# Patient Record
Sex: Male | Born: 1960
Health system: Southern US, Community
[De-identification: ages and names within clinical notes are randomized; demographics above are authoritative.]

## PROBLEM LIST (undated history)

## (undated) DIAGNOSIS — S8410XA Injury of peroneal nerve at lower leg level, unspecified leg, initial encounter: Secondary | ICD-10-CM

## (undated) DIAGNOSIS — I10 Essential (primary) hypertension: Secondary | ICD-10-CM

## (undated) DIAGNOSIS — R002 Palpitations: Secondary | ICD-10-CM

## (undated) DIAGNOSIS — I493 Ventricular premature depolarization: Secondary | ICD-10-CM

## (undated) HISTORY — PX: KNEE ARTHROCENTESIS: SUR44

## (undated) HISTORY — PX: CHOLECYSTECTOMY: SHX55

## (undated) HISTORY — PX: OTHER SURGICAL HISTORY: SHX169

## (undated) HISTORY — PX: BACK SURGERY: SHX140

## (undated) HISTORY — PX: COLONOSCOPY W/ POLYPECTOMY: SHX1380

---

## 2015-11-19 ENCOUNTER — Emergency Department (HOSPITAL_COMMUNITY): Payer: BLUE CROSS/BLUE SHIELD

## 2015-11-19 ENCOUNTER — Emergency Department (HOSPITAL_COMMUNITY)
Admission: EM | Admit: 2015-11-19 | Discharge: 2015-11-19 | Disposition: A | Discharge status: Home / Self Care | Payer: BLUE CROSS/BLUE SHIELD | Attending: Emergency Medicine | Admitting: Emergency Medicine

## 2015-11-19 ENCOUNTER — Encounter (HOSPITAL_COMMUNITY): Payer: Self-pay | Admitting: Emergency Medicine

## 2015-11-19 DIAGNOSIS — F17210 Nicotine dependence, cigarettes, uncomplicated: Secondary | ICD-10-CM | POA: Diagnosis not present

## 2015-11-19 DIAGNOSIS — I493 Ventricular premature depolarization: Secondary | ICD-10-CM | POA: Insufficient documentation

## 2015-11-19 DIAGNOSIS — Z79899 Other long term (current) drug therapy: Secondary | ICD-10-CM | POA: Insufficient documentation

## 2015-11-19 DIAGNOSIS — E876 Hypokalemia: Secondary | ICD-10-CM | POA: Insufficient documentation

## 2015-11-19 DIAGNOSIS — R002 Palpitations: Secondary | ICD-10-CM

## 2015-11-19 DIAGNOSIS — I10 Essential (primary) hypertension: Secondary | ICD-10-CM | POA: Insufficient documentation

## 2015-11-19 DIAGNOSIS — Z7982 Long term (current) use of aspirin: Secondary | ICD-10-CM | POA: Diagnosis not present

## 2015-11-19 HISTORY — DX: Injury of peroneal nerve at lower leg level, unspecified leg, initial encounter: S84.10XA

## 2015-11-19 HISTORY — DX: Essential (primary) hypertension: I10

## 2015-11-19 HISTORY — DX: Palpitations: R00.2

## 2015-11-19 HISTORY — DX: Ventricular premature depolarization: I49.3

## 2015-11-19 LAB — I-STAT TROPONIN, ED: Troponin i, poc: 0 ng/mL (ref 0.00–0.08)

## 2015-11-19 LAB — BASIC METABOLIC PANEL
Anion gap: 9 (ref 5–15)
BUN: 6 mg/dL (ref 6–20)
CHLORIDE: 100 mmol/L — AB (ref 101–111)
CO2: 28 mmol/L (ref 22–32)
CREATININE: 0.83 mg/dL (ref 0.61–1.24)
Calcium: 10.2 mg/dL (ref 8.9–10.3)
GFR calc Af Amer: >60 mL/min (ref >60)
GFR calc non Af Amer: >60 mL/min (ref >60)
Glucose, Bld: 114 mg/dL — ABNORMAL HIGH (ref 65–99)
Potassium: 3.3 mmol/L — ABNORMAL LOW (ref 3.5–5.1)
Sodium: 137 mmol/L (ref 135–145)

## 2015-11-19 LAB — CBC
HEMATOCRIT: 47.8 % (ref 39.0–52.0)
HEMOGLOBIN: 16.3 g/dL (ref 13.0–17.0)
MCH: 30.7 pg (ref 26.0–34.0)
MCHC: 34.1 g/dL (ref 30.0–36.0)
MCV: 90 fL (ref 78.0–100.0)
PLATELETS: 241 10*3/uL (ref 150–400)
RBC: 5.31 MIL/uL (ref 4.22–5.81)
RDW: 12.3 % (ref 11.5–15.5)
WBC: 7.4 10*3/uL (ref 4.0–10.5)

## 2015-11-19 MED ORDER — POTASSIUM CHLORIDE CRYS ER 20 MEQ PO TBCR
40.0000 meq | EXTENDED_RELEASE_TABLET | Freq: Once | ORAL | Status: AC
  Administered 2015-11-19: 40 meq via ORAL
  Filled 2015-11-19: qty 2

## 2015-11-19 MED ORDER — POTASSIUM CHLORIDE CRYS ER 20 MEQ PO TBCR: 20.0000 meq | EXTENDED_RELEASE_TABLET | Freq: Every day | ORAL | 0 refills | Status: DC

## 2015-11-19 NOTE — ED Triage Notes (Addendum)
Pt has hx of palpitations, has been worked up at [facility], but has had more PVC's in the past couple days-- makes pt short of breath.  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  Pt c/o throat tightness with feeling like it is difficult to swallow.  Did drink a mountain dew and 2 cups of coffee this morning.

## 2015-11-19 NOTE — Discharge Instructions (Signed)
Get plenty of rest and drink a lot of fluids.  Try to decrease your tobacco intake.  Try to avoid caffeine.  Try to increase your potassium intake in your diet.

## 2015-11-19 NOTE — ED Provider Notes (Signed)
[facility]-EMERGENCY DEPT Provider Note   CSN: [id number] Arrival date & time: 11/19/15  1352  First Provider Contact:  First MD Initiated Contact with Patient 11/19/15 1648        History   Chief Complaint Chief Complaint  Patient presents with  . Palpitations    HPI Stanley Lee is a 55 y.o. male.  He has noticed palpitations, several each hour for the last couple of days. He has a vague sensation of "fatigue", and a tight feeling in his throat. He denies near-syncope, syncope, chest pain, shortness of breath, cough, fever, chills, nausea, vomiting or change in bowel and urinary habits. History of similar palpitations about 10 years ago when he had a comprehensive evaluation. He is trying to decrease his tobacco intake. He does not drink alcohol or take illegal drugs. He denies stress. He is not currently employed because of chronic back pain and left leg weakness. He does not currently have a cardiologist. There are no other no modifying factors.   HPI  Past Medical History:  Diagnosis Date  . Heart palpitations   . Hypertension   . Peroneal nerve injury   . PVC's (premature ventricular contractions)     There are no active problems to display for this patient.   Past Surgical History:  Procedure Laterality Date  . BACK SURGERY    . CHOLECYSTECTOMY    . KNEE ARTHROCENTESIS    . right shoulder surgery         Home Medications    Prior to Admission medications   Medication Sig Start Date End Date Taking? Authorizing Provider  aspirin (ASPIRIN EC) 81 MG EC tablet Take 81 mg by mouth every morning. Swallow whole.   Yes Historical Provider, MD  CALCIUM-MAGNESIUM-ZINC PO Take 1 tablet by mouth every morning.   Yes Historical Provider, MD  Cholecalciferol (VITAMIN D3 PO) Take 1 tablet by mouth daily.   Yes Historical Provider, MD  Coenzyme Q10 (COQ-10) 200 MG CAPS Take 200 mg by mouth every morning.   Yes Historical Provider, MD  CRANBERRY PO Take 1 tablet by mouth  daily.   Yes Historical Provider, MD  losartan-hydrochlorothiazide (HYZAAR) 100-12.5 MG tablet Take 1 tablet by mouth daily. 10/17/15  Yes Historical Provider, MD  Omega-3 Fatty Acids (FISH OIL) 1000 MG CAPS Take 2,000 mg by mouth every morning.   Yes Historical Provider, MD  SELENIUM PO Take 1 tablet by mouth every morning.   Yes Historical Provider, MD  potassium chloride SA (K-DUR,KLOR-CON) 20 MEQ tablet Take 1 tablet (20 mEq total) by mouth daily. 11/19/15   Mancel Bale, MD    Family History No family history on file.  Social History Social History  Substance Use Topics  . Smoking status: Current Every Day Smoker    Types: Cigarettes  . Smokeless tobacco: Never Used  . Alcohol use No     Allergies   Review of patient's allergies indicates not on file.   Review of Systems Review of Systems  All other systems reviewed and are negative.    Physical Exam Updated Vital Signs BP 115/75   Pulse 68   Temp 98.7 F (37.1 C) (Oral)   Resp 22   Ht  (1.93 m)   Wt 240 lb (108.9 kg)   SpO2 99%   BMI 29.21 kg/m   Physical Exam  Constitutional: He is oriented to person, place, and time. He appears well-developed and well-nourished. No distress.  HENT:  Head: Normocephalic and atraumatic.  Right Ear:  External ear normal.  Left Ear: External ear normal.  Eyes: Conjunctivae and EOM are normal. Pupils are equal, round, and reactive to light.  Neck: Normal range of motion and phonation normal. Neck supple.  Cardiovascular: Normal rate, regular rhythm and normal heart sounds.   Pulmonary/Chest: Effort normal and breath sounds normal. He exhibits no bony tenderness.  Abdominal: Soft. There is no tenderness.  Musculoskeletal: Normal range of motion.  Neurological: He is alert and oriented to person, place, and time. No cranial nerve deficit or sensory deficit. Coordination normal.  Skin: Skin is warm, dry and intact.  Psychiatric: He has a normal mood and affect. His behavior  is normal. Judgment and thought content normal.  Nursing note and vitals reviewed.    ED Treatments / Results  Labs (all labs ordered are listed, but only abnormal results are displayed) Labs Reviewed  BASIC METABOLIC PANEL - Abnormal; Notable for the following:       Result Value   Potassium 3.3 (*)    Chloride 100 (*)    Glucose, Bld 114 (*)    All other components within normal limits  CBC  I-STAT TROPOININ, ED    EKG  EKG Interpretation  Date/Time:  Saturday November 19 2015 13:59:28 EDT Ventricular Rate:  95 PR Interval:  156 QRS Duration: 102 QT Interval:  340 QTC Calculation: 427 R Axis:   55 Text Interpretation:  Sinus rhythm with occasional Premature ventricular complexes Otherwise normal ECG No old tracing to compare Confirmed by [facility]  MD, Brynli Ollis [phone]) on 11/19/2015 4:49:18 PM       Radiology Dg Chest 2 View  Result Date: 11/19/2015 CLINICAL DATA:  Palpitations. EXAM: CHEST  2 VIEW COMPARISON:  None FINDINGS: The heart size and mediastinal contours are within normal limits. Both lungs are clear. Spondylosis noted within the thoracic spine. IMPRESSION: No active cardiopulmonary disease. Electronically Signed   By: Signa Kell M.D.   On: 11/19/2015 15:23    Procedures Procedures (including critical care time)  Medications Ordered in ED Medications  potassium chloride SA (K-DUR,KLOR-CON) CR tablet 40 mEq (not administered)     Initial Impression / Assessment and Plan / ED Course  I have reviewed the triage vital signs and the nursing notes.  Pertinent labs & imaging results that were available during my care of the patient were reviewed by me and considered in my medical decision making (see chart for details).  Clinical Course    Medications  potassium chloride SA (K-DUR,KLOR-CON) CR tablet 40 mEq (not administered)    Patient Vitals for the past 24 hrs:  BP Temp Temp src Pulse Resp SpO2 Height Weight  11/19/15 1745 115/75 - - 68 22 99 % - -    11/19/15 1653 136/85 - - 75 24 99 % - -  11/19/15 1402 137/94 98.7 F (37.1 C) Oral 86 18 100 % 6\' 4"  (1.93 m) 240 lb (108.9 kg)    6:06 PM Reevaluation with update and discussion. After initial assessment and treatment, an updated evaluation reveals Findings discussed. Patient now/strains.Mancel Bale L    Final Clinical Impressions(s) / ED Diagnoses   Final diagnoses:  Palpitations  PVC's (premature ventricular contractions)  Hypokalemia    Nursing Notes Reviewed/ Care Coordinated Applicable Imaging Reviewed Interpretation of Laboratory Data incorporated into ED treatment  The patient appears reasonably screened and/or stabilized for discharge and I doubt any other medical condition or other [facility] requiring further screening, evaluation, or treatment in the ED at this time prior to  discharge.  Plan: Home Medications- Continue; Home Treatments- rest, decrease tobacco and caffeine intake; return here if the recommended treatment, does not improve the symptoms; Recommended follow up- PCP 1 week   New Prescriptions New Prescriptions   POTASSIUM CHLORIDE SA (K-DUR,KLOR-CON) 20 MEQ TABLET    Take 1 tablet (20 mEq total) by mouth daily.     Mancel Bale, MD 11/19/15 [phone]

## 2015-12-15 ENCOUNTER — Ambulatory Visit (INDEPENDENT_AMBULATORY_CARE_PROVIDER_SITE_OTHER): Payer: Medicare Other | Admitting: Otolaryngology

## 2015-12-21 ENCOUNTER — Encounter: Payer: Self-pay | Admitting: Internal Medicine

## 2015-12-21 ENCOUNTER — Ambulatory Visit (INDEPENDENT_AMBULATORY_CARE_PROVIDER_SITE_OTHER): Payer: BLUE CROSS/BLUE SHIELD | Admitting: Internal Medicine

## 2015-12-21 VITALS — BP 116/80 | HR 71 | Ht 76.0 in | Wt 230.0 lb

## 2015-12-21 DIAGNOSIS — Z720 Tobacco use: Secondary | ICD-10-CM

## 2015-12-21 DIAGNOSIS — R042 Hemoptysis: Secondary | ICD-10-CM

## 2015-12-21 DIAGNOSIS — F17210 Nicotine dependence, cigarettes, uncomplicated: Secondary | ICD-10-CM | POA: Insufficient documentation

## 2015-12-21 NOTE — Assessment & Plan Note (Signed)
Spirometry 12/21/2015  wnl on no RX    > 3 min discussion I reviewed the Fletcher curve with the patient that basically indicates  if you quit smoking when your best day FEV1 is still well preserved (as is clearly  the case here)  it is highly unlikely you will progress to severe disease and informed the patient there was  no medication on the market that has proven to alter the curve/ its downward trajectory  or the likelihood of progression of their disease(unlike other chronic medical conditions such as atheroclerosis where we do think we can change the natural hx with risk reducing meds)    Therefore stopping smoking and maintaining abstinence is the most important aspect of care, not choice of inhalers or for that matter, doctors.

## 2015-12-21 NOTE — Assessment & Plan Note (Signed)
In setting of RMSF x 36h/ min volume > declined cxr 12/21/2015   He had a nl cxr 11/19/15 and the amt of blood he describes is not likely to represent anything but mild tracheobronchitis ? Related to smoking vs RMSF but either way resolved on doxy  Discussed approp screening for lung ca - see avs  Total time devoted to counseling  = 35/4627m review case with pt/wife Selena BattenKim also a smoker/ discussion of options/alternatives/ personally creating written instructions  in presence of pt  then going over those specific  Instructions directly with the pt including how to use all of the meds but in particular covering each new medication in detail and the difference between the maintenance/automatic meds and the prns using an action plan format for the latter.

## 2015-12-21 NOTE — Patient Instructions (Addendum)
You do not appear to have any significant copd so unlikely you ever will unless you resume smoking  Low-dose CT lung cancer screening is recommended for patients who are 5855-53 years of age with a 30+ pack-year history of smoking, and who are currently smoking or quit <=15 years ago.   You are not eligible for another 3 years and this test is frequently not reimbursed but it is the best screening tool we have - let me know if you would like to pursue it   Pulmonary follow up is as needed

## 2015-12-21 NOTE — Progress Notes (Signed)
Subjective:     Patient ID: Stanley Lee, male   DOB: 28-Jan-1961,     MRN: [id number]  HPI  [age] yowm active smoker limited by L radiculopathy from back injury 2011 freq tick bites >   With acute onset  fever, HA only >  2 days [facility] [place] [facility] [place] [place] > 10 day course of doxy improved  but referred to pulmonary clinic 12/21/2015 by Dr   Stanley Lee for hemoptysis.    12/21/2015 1st [facility] office visit/ Stanley Lee   Chief Complaint  Patient presents with  . Pulmonary Consult    Referred by Dr. Lura EmVinitkumar Lee. Pt states he was dxed with RMSF on 12/17/15. Before he was dxed he had minimal hemoptysis for 2 days. He denies any respiratory co's today.   new onset hemoptysis on 12/14/15= total of two specks of blood over  36 h and resolved on while on advil and one aspirin > resolved off advil and on baby aspirin never sob/ cp and fever gone, never rash, HA gone now. Plans to see Stanley Lee for "stiff neck" though this has also resolved  No obvious day to day or daytime variability or assoc excess/ purulent sputum or mucus plugs or hemoptysis or cp or chest tightness, subjective wheeze or overt sinus or hb symptoms. No unusual exp hx or h/o childhood pna/ asthma or knowledge of premature birth.  Sleeping ok without nocturnal  or early am exacerbation  of respiratory  c/o's or need for noct saba. Also denies any obvious fluctuation of symptoms with weather or environmental changes or other aggravating or alleviating factors except as outlined above   Current Medications, Allergies, Complete Past Medical History, Past Surgical History, Family History, and Social History were reviewed in [facility] electronic medical record.  ROS  The following are not active complaints unless bolded sore throat, dysphagia, dental problems, itching, sneezing,  nasal congestion or excess/ purulent secretions, ear ache,   fever, chills, sweats, unintended wt loss, classically pleuritic or  exertional cp,  orthopnea pnd or leg swelling, presyncope, palpitations, abdominal pain, anorexia, nausea, vomiting, diarrhea  or change in bowel or bladder habits, change in stools or urine, dysuria,hematuria,  rash, arthralgias, visual complaints, headache, numbness, weakness or ataxia or problems with walking or coordination,  change in mood/affect or memory.           Review of Systems     Objective:   Physical Exam amb wm nad   Wt Readings from Last 3 Encounters:  12/21/15 230 lb (104.3 kg)  11/19/15 240 lb (108.9 kg)    Vital signs reviewed   HEENT: nl dentition, turbinates, and oropharynx. Nl external ear canals without cough reflex   NECK :  without JVD/Nodes/TM/ nl carotid upstrokes bilaterally   LUNGS: no acc muscle use,  Nl contour chest which is clear to A and P bilaterally without cough on insp or exp maneuvers   CV:  RRR  no s3 or murmur or increase in P2, no edema   ABD:  soft and nontender with nl inspiratory excursion in the supine position. No bruits or organomegaly, bowel sounds nl  MS:  Nl gait/ ext warm without deformities, calf tenderness, cyanosis or clubbing No obvious joint restrictions   SKIN: warm and dry without lesions    NEURO:  alert, approp, nl sensorium with  no motor deficits    Pt refused repeat cxr since just had one in 11/19/15 (note this was before the hemoptysis)  I personally reviewed images and agree with radiology impression as follows:  CXR:   11/29/15  No active cardiopulmonary disease.      Assessment:

## 2015-12-29 ENCOUNTER — Ambulatory Visit (INDEPENDENT_AMBULATORY_CARE_PROVIDER_SITE_OTHER): Payer: BLUE CROSS/BLUE SHIELD | Admitting: Otolaryngology

## 2015-12-29 DIAGNOSIS — H9313 Tinnitus, bilateral: Secondary | ICD-10-CM | POA: Diagnosis not present

## 2015-12-29 DIAGNOSIS — R070 Pain in throat: Secondary | ICD-10-CM | POA: Diagnosis not present

## 2016-09-21 DIAGNOSIS — G4733 Obstructive sleep apnea (adult) (pediatric): Secondary | ICD-10-CM | POA: Diagnosis not present

## 2016-09-21 DIAGNOSIS — I10 Essential (primary) hypertension: Secondary | ICD-10-CM | POA: Diagnosis not present

## 2017-01-28 DIAGNOSIS — M545 Low back pain: Secondary | ICD-10-CM | POA: Diagnosis not present

## 2017-01-28 DIAGNOSIS — Z6825 Body mass index (BMI) 25.0-25.9, adult: Secondary | ICD-10-CM | POA: Diagnosis not present

## 2017-01-28 DIAGNOSIS — F17210 Nicotine dependence, cigarettes, uncomplicated: Secondary | ICD-10-CM | POA: Diagnosis not present

## 2017-01-28 DIAGNOSIS — Z23 Encounter for immunization: Secondary | ICD-10-CM | POA: Diagnosis not present

## 2017-01-28 DIAGNOSIS — G4733 Obstructive sleep apnea (adult) (pediatric): Secondary | ICD-10-CM | POA: Diagnosis not present

## 2017-01-28 DIAGNOSIS — I10 Essential (primary) hypertension: Secondary | ICD-10-CM | POA: Diagnosis not present

## 2017-04-02 DIAGNOSIS — Z1212 Encounter for screening for malignant neoplasm of rectum: Secondary | ICD-10-CM | POA: Diagnosis not present

## 2017-04-02 DIAGNOSIS — Z1211 Encounter for screening for malignant neoplasm of colon: Secondary | ICD-10-CM | POA: Diagnosis not present

## 2017-04-23 ENCOUNTER — Emergency Department (HOSPITAL_COMMUNITY)
Admission: EM | Admit: 2017-04-23 | Discharge: 2017-04-23 | Disposition: A | Discharge status: Home / Self Care | Payer: Commercial Managed Care - PPO | Attending: Emergency Medicine | Admitting: Emergency Medicine

## 2017-04-23 ENCOUNTER — Encounter (HOSPITAL_COMMUNITY): Payer: Self-pay | Admitting: Emergency Medicine

## 2017-04-23 DIAGNOSIS — R002 Palpitations: Secondary | ICD-10-CM | POA: Diagnosis not present

## 2017-04-23 DIAGNOSIS — Z7982 Long term (current) use of aspirin: Secondary | ICD-10-CM | POA: Insufficient documentation

## 2017-04-23 DIAGNOSIS — I10 Essential (primary) hypertension: Secondary | ICD-10-CM | POA: Insufficient documentation

## 2017-04-23 DIAGNOSIS — F17210 Nicotine dependence, cigarettes, uncomplicated: Secondary | ICD-10-CM | POA: Diagnosis not present

## 2017-04-23 DIAGNOSIS — Z79899 Other long term (current) drug therapy: Secondary | ICD-10-CM | POA: Diagnosis not present

## 2017-04-23 LAB — BASIC METABOLIC PANEL
Anion gap: 9 (ref 5–15)
BUN: 13 mg/dL (ref 6–20)
CO2: 27 mmol/L (ref 22–32)
Calcium: 9.6 mg/dL (ref 8.9–10.3)
Chloride: 104 mmol/L (ref 101–111)
Creatinine, Ser: 0.8 mg/dL (ref 0.61–1.24)
GFR calc Af Amer: >60 mL/min (ref >60)
GFR calc non Af Amer: >60 mL/min (ref >60)
Glucose, Bld: 118 mg/dL — ABNORMAL HIGH (ref 65–99)
Potassium: 4.2 mmol/L (ref 3.5–5.1)
Sodium: 140 mmol/L (ref 135–145)

## 2017-04-23 LAB — CBC WITH DIFFERENTIAL/PLATELET
Basophils Absolute: 0.1 10*3/uL (ref 0.0–0.1)
Basophils Relative: 1 %
Eosinophils Absolute: 0.3 10*3/uL (ref 0.0–0.7)
Eosinophils Relative: 4 %
HCT: 47.8 % (ref 39.0–52.0)
Hemoglobin: 15.5 g/dL (ref 13.0–17.0)
Lymphocytes Relative: 21 %
Lymphs Abs: 1.4 10*3/uL (ref 0.7–4.0)
MCH: 30.2 pg (ref 26.0–34.0)
MCHC: 32.4 g/dL (ref 30.0–36.0)
MCV: 93.2 fL (ref 78.0–100.0)
Monocytes Absolute: 0.4 10*3/uL (ref 0.1–1.0)
Monocytes Relative: 6 %
Neutro Abs: 4.6 10*3/uL (ref 1.7–7.7)
Neutrophils Relative %: 68 %
Platelets: 203 10*3/uL (ref 150–400)
RBC: 5.13 MIL/uL (ref 4.22–5.81)
RDW: 12.9 % (ref 11.5–15.5)
WBC: 6.8 10*3/uL (ref 4.0–10.5)

## 2017-04-23 MED ORDER — PROPRANOLOL HCL 20 MG PO TABS: 20.0000 mg | ORAL_TABLET | Freq: Three times a day (TID) | ORAL | 0 refills | Status: DC | PRN

## 2017-04-23 NOTE — ED Triage Notes (Signed)
Pt reports he has been having episodes of lightheadedness with bp higher than normal.  Also feeling like heart skipping when trying to sleep.

## 2017-04-23 NOTE — ED Provider Notes (Signed)
[facility] EMERGENCY DEPARTMENT Provider Note   CSN: [id number] Arrival date & time: 04/23/17  5284     History   Chief Complaint Chief Complaint  Patient presents with  . Palpitations    HPI Stanley Lee is a 57 y.o. male.   Palpitations       57 year old male with palpitations.  Patient thinks he may be having PVCs.  He has a history of the same.  For the last couple nights he has noticed intermittent palpitations.  He feels like his heart is skipping a beat.  Seemingly random.  Happens anywhere from a few seconds up to several minutes apart.  He denies any associated chest pain, dyspnea, nausea or diaphoresis.  He has not really noticed them during the day.  He reports that he has had issues with PVCs previously several years ago.  He reports workup including stress testing and echocardiogram without significant abnormalities from his best recollection.    He is also complaining what sounds like vertigo.  This is chronic.  Very positional.  When he extends his head and rotates the right he has a spinning sensation.  Past Medical History:  Diagnosis Date  . Heart palpitations   . Hypertension   . Peroneal nerve injury   . PVC's (premature ventricular contractions)     Patient Active Problem List   Diagnosis Date Noted  . Cigarette smoker 12/21/2015  . Hemoptysis 12/21/2015    Past Surgical History:  Procedure Laterality Date  . BACK SURGERY    . CHOLECYSTECTOMY    . KNEE ARTHROCENTESIS    . right shoulder surgery         Home Medications    Prior to Admission medications   Medication Sig Start Date End Date Taking? Authorizing Provider  aspirin (ASPIRIN EC) 81 MG EC tablet Take 81 mg by mouth every morning. Swallow whole.    [provider]  CALCIUM-MAGNESIUM-ZINC PO Take 1 tablet by mouth every morning.    [provider]  Cholecalciferol (VITAMIN D3 PO) Take 1 tablet by mouth daily.    [provider]  Coenzyme Q10  (COQ-10) 200 MG CAPS Take 200 mg by mouth every morning.    [provider]  CRANBERRY PO Take 1 tablet by mouth daily.    [provider]  doxycycline (VIBRAMYCIN) 100 MG capsule Take 1 capsule by mouth 2 (two) times daily. 12/13/15   [provider]  losartan-hydrochlorothiazide (HYZAAR) 100-12.5 MG tablet Take 1 tablet by mouth daily. 10/17/15   [provider]  Omega-3 Fatty Acids (FISH OIL) 1000 MG CAPS Take 2,000 mg by mouth every morning.    [provider]  potassium chloride SA (K-DUR,KLOR-CON) 20 MEQ tablet Take 1 tablet (20 mEq total) by mouth daily. 11/19/15   Mancel Bale, MD  SELENIUM PO Take 1 tablet by mouth every morning.    [provider]  UNABLE TO FIND Med Name: CPAP    [provider]    Family History History reviewed. No pertinent family history.  Social History Social History   Tobacco Use  . Smoking status: Current Every Day Smoker    Packs/day: 1.00    Years: 24.00    Pack years: 24.00    Types: Cigarettes  . Smokeless tobacco: Never Used  Substance Use Topics  . Alcohol use: No  . Drug use: Not on file     Allergies   Accupril [quinapril hcl]   Review of Systems Review of Systems  Cardiovascular: Positive for palpitations.    All systems reviewed and negative, other than as noted in HPI.   Physical Exam Updated Vital Signs BP 139/88   Pulse 64   Temp 98 F (36.7 C) (Oral)   Resp (!) 25   Ht 6\' 4"  (1.93 m)   Wt 102.1 kg (225 lb)   SpO2 99%   BMI 27.39 kg/m   Physical Exam  Constitutional: He is oriented to person, place, and time. He appears well-developed and well-nourished. No distress.  HENT:  Head: Normocephalic and atraumatic.  Eyes: Conjunctivae are normal. Right eye exhibits no discharge. Left eye exhibits no discharge.  Neck: Neck supple.  Cardiovascular: Normal rate, regular rhythm and normal heart sounds. Exam reveals no gallop and no friction rub.  No murmur  heard. Pulmonary/Chest: Effort normal and breath sounds normal. No respiratory distress.  Abdominal: Soft. He exhibits no distension. There is no tenderness.  Musculoskeletal: He exhibits no edema or tenderness.  Neurological: He is alert and oriented to person, place, and time. No cranial nerve deficit. Coordination normal.  Skin: Skin is warm and dry.  Psychiatric: He has a normal mood and affect. His behavior is normal. Thought content normal.  Nursing note and vitals reviewed.    ED Treatments / Results  Labs (all labs ordered are listed, but only abnormal results are displayed) Labs Reviewed  BASIC METABOLIC PANEL - Abnormal; Notable for the following components:      Result Value   Glucose, Bld 118 (*)    All other components within normal limits  CBC WITH DIFFERENTIAL/PLATELET    EKG  EKG Interpretation  Date/Time:  Tuesday April 23 2017 07:28:34 EST Ventricular Rate:  68 PR Interval:    QRS Duration: 108 QT Interval:  390 QTC Calculation: 415 R Axis:   66 Text Interpretation:  Sinus rhythm RSR' in V1 or V2, right VCD or RVH Confirmed by Raeford RazorKohut, Azuri Bozard [phone]([id number]) on 04/23/2017 8:14:49 AM       Radiology No results found.  Procedures Procedures (including critical care time)  Medications Ordered in ED Medications - No data to display   Initial Impression / Assessment and Plan / ED Course  I have reviewed the triage vital signs and the nursing notes.  Pertinent labs & imaging results that were available during my care of the patient were reviewed by me and considered in my medical decision making (see chart for details).     57 year old male with palpitations.  From his description, it does sound like he is having PVCs.  His EKG today is fairly unremarkable.  I did not notice any on the monitor although he was largely asymptomatic while in the emergency room.  Basic labs unremarkable. I do appreciate a murmur. Discussed trying a low-dose of a beta blocker on an  as-needed basis until you follow-up with his PCP.  He may benefit from a Holter monitor.  I doubt emergent process.  Associated pain.  No exertional symptoms recently.  He reports previous workup including echo and stress testing unremarkable although this was several years ago.  Vertigo is likely a separate process and chronic from his description.  No overt red flags.  Final Clinical Impressions(s) / ED Diagnoses   Final diagnoses:  Palpitations    ED Discharge Orders    None       Raeford RazorKohut, Abdullah Rizzi, MD 04/23/17 [phone]

## 2017-06-04 DIAGNOSIS — I10 Essential (primary) hypertension: Secondary | ICD-10-CM | POA: Diagnosis not present

## 2017-06-04 DIAGNOSIS — R002 Palpitations: Secondary | ICD-10-CM | POA: Diagnosis not present

## 2017-06-04 DIAGNOSIS — Z6827 Body mass index (BMI) 27.0-27.9, adult: Secondary | ICD-10-CM | POA: Diagnosis not present

## 2017-06-04 DIAGNOSIS — F17210 Nicotine dependence, cigarettes, uncomplicated: Secondary | ICD-10-CM | POA: Diagnosis not present

## 2017-06-04 DIAGNOSIS — R51 Headache: Secondary | ICD-10-CM | POA: Diagnosis not present

## 2017-06-07 DIAGNOSIS — R9082 White matter disease, unspecified: Secondary | ICD-10-CM | POA: Diagnosis not present

## 2017-06-07 DIAGNOSIS — R42 Dizziness and giddiness: Secondary | ICD-10-CM | POA: Diagnosis not present

## 2017-06-07 DIAGNOSIS — R51 Headache: Secondary | ICD-10-CM | POA: Diagnosis not present

## 2017-07-10 DIAGNOSIS — M4802 Spinal stenosis, cervical region: Secondary | ICD-10-CM | POA: Diagnosis not present

## 2017-07-10 DIAGNOSIS — M5412 Radiculopathy, cervical region: Secondary | ICD-10-CM | POA: Diagnosis not present

## 2017-07-10 DIAGNOSIS — M50221 Other cervical disc displacement at C4-C5 level: Secondary | ICD-10-CM | POA: Diagnosis not present

## 2017-07-10 DIAGNOSIS — M47812 Spondylosis without myelopathy or radiculopathy, cervical region: Secondary | ICD-10-CM | POA: Diagnosis not present

## 2017-08-28 DIAGNOSIS — M47812 Spondylosis without myelopathy or radiculopathy, cervical region: Secondary | ICD-10-CM | POA: Diagnosis not present

## 2017-09-26 DIAGNOSIS — R51 Headache: Secondary | ICD-10-CM | POA: Diagnosis not present

## 2017-09-26 DIAGNOSIS — Z6827 Body mass index (BMI) 27.0-27.9, adult: Secondary | ICD-10-CM | POA: Diagnosis not present

## 2017-09-26 DIAGNOSIS — F17210 Nicotine dependence, cigarettes, uncomplicated: Secondary | ICD-10-CM | POA: Diagnosis not present

## 2017-09-26 DIAGNOSIS — I10 Essential (primary) hypertension: Secondary | ICD-10-CM | POA: Diagnosis not present

## 2017-09-26 DIAGNOSIS — M19041 Primary osteoarthritis, right hand: Secondary | ICD-10-CM | POA: Diagnosis not present

## 2017-09-26 DIAGNOSIS — R002 Palpitations: Secondary | ICD-10-CM | POA: Diagnosis not present

## 2017-11-01 DIAGNOSIS — I10 Essential (primary) hypertension: Secondary | ICD-10-CM | POA: Diagnosis not present

## 2017-11-01 DIAGNOSIS — G4733 Obstructive sleep apnea (adult) (pediatric): Secondary | ICD-10-CM | POA: Diagnosis not present

## 2018-01-16 DIAGNOSIS — Z23 Encounter for immunization: Secondary | ICD-10-CM | POA: Diagnosis not present

## 2018-01-16 DIAGNOSIS — Z6828 Body mass index (BMI) 28.0-28.9, adult: Secondary | ICD-10-CM | POA: Diagnosis not present

## 2018-01-16 DIAGNOSIS — I10 Essential (primary) hypertension: Secondary | ICD-10-CM | POA: Diagnosis not present

## 2018-01-16 DIAGNOSIS — R5383 Other fatigue: Secondary | ICD-10-CM | POA: Diagnosis not present

## 2018-01-16 DIAGNOSIS — F17210 Nicotine dependence, cigarettes, uncomplicated: Secondary | ICD-10-CM | POA: Diagnosis not present

## 2018-01-29 DIAGNOSIS — H4423 Degenerative myopia, bilateral: Secondary | ICD-10-CM | POA: Diagnosis not present

## 2018-01-29 DIAGNOSIS — H5213 Myopia, bilateral: Secondary | ICD-10-CM | POA: Diagnosis not present

## 2018-01-29 DIAGNOSIS — H52203 Unspecified astigmatism, bilateral: Secondary | ICD-10-CM | POA: Diagnosis not present

## 2018-01-29 DIAGNOSIS — H4389 Other disorders of vitreous body: Secondary | ICD-10-CM | POA: Diagnosis not present

## 2018-03-20 ENCOUNTER — Ambulatory Visit (INDEPENDENT_AMBULATORY_CARE_PROVIDER_SITE_OTHER): Payer: Commercial Managed Care - PPO | Admitting: Cardiology

## 2018-03-20 ENCOUNTER — Encounter

## 2018-03-20 ENCOUNTER — Encounter: Payer: Self-pay | Admitting: Cardiology

## 2018-03-20 VITALS — BP 138/78 | HR 63 | Ht 76.0 in | Wt 242.2 lb

## 2018-03-20 DIAGNOSIS — R002 Palpitations: Secondary | ICD-10-CM

## 2018-03-20 DIAGNOSIS — R5383 Other fatigue: Secondary | ICD-10-CM | POA: Diagnosis not present

## 2018-03-20 NOTE — Progress Notes (Signed)
Clinical Summary [name] is a 57 y.o.male seen as new consult, referred by Dr Stanley Lee for fatigue   1. Fatigue/Palpitations - Long history of palpitations and PVCs.  - previous workup in [place] [facility], about 8 years ago. Had echocardiogram, chemical stress test, heart monitor x 7 days. He reports negative workup other than being told he had some PVCs   - Can have palpitations at times. Variable in duration, no clear pattern. Better with toprol, but did have significant fatigue after first starting but tolerating 12.5mg  bid dosing.  - occasional unexplained fatigue at times. No palpitations with these episodes. Can get diaphoretic, dizzy.  - has weaned caffeine, no EtoH. - can have some vertigo.  - denies any chest pain.    Past Medical History:  Diagnosis Date  . Heart palpitations   . Hypertension   . Peroneal nerve injury   . PVC's (premature ventricular contractions)      Allergies  Allergen Reactions  . Accupril [Quinapril Hcl] Other (See Comments)    syncope     Current Outpatient Medications  Medication Sig Dispense Refill  . aspirin (ASPIRIN EC) 81 MG EC tablet Take 81 mg by mouth every morning. Swallow whole.    Marland Kitchen. CALCIUM-MAGNESIUM-ZINC PO Take 1 tablet by mouth every morning.    . Cholecalciferol (VITAMIN D3 PO) Take 1 tablet by mouth daily.    . Coenzyme Q10 (COQ-10) 200 MG CAPS Take 200 mg by mouth every morning.    Marland Kitchen. CRANBERRY PO Take 1 tablet by mouth daily.    Marland Kitchen. doxycycline (VIBRAMYCIN) 100 MG capsule Take 1 capsule by mouth 2 (two) times daily.    Marland Kitchen. losartan-hydrochlorothiazide (HYZAAR) 100-12.5 MG tablet Take 1 tablet by mouth daily.  3  . Omega-3 Fatty Acids (FISH OIL) 1000 MG CAPS Take 2,000 mg by mouth every morning.    . potassium chloride SA (K-DUR,KLOR-CON) 20 MEQ tablet Take 1 tablet (20 mEq total) by mouth daily. 30 tablet 0  . propranolol (INDERAL) 20 MG tablet Take 1 tablet (20 mg total) by mouth 3 (three) times daily as needed  (palpitations). 30 tablet 0  . SELENIUM PO Take 1 tablet by mouth every morning.    Marland Kitchen. UNABLE TO FIND Med Name: CPAP     No current facility-administered medications for this visit.      Past Surgical History:  Procedure Laterality Date  . BACK SURGERY    . CHOLECYSTECTOMY    . KNEE ARTHROCENTESIS    . right shoulder surgery       Allergies  Allergen Reactions  . Accupril [Quinapril Hcl] Other (See Comments)    syncope      No family history on file.   Social History [name] reports that he has been smoking cigarettes. He has a 24.00 pack-year smoking history. He has never used smokeless tobacco. [name] reports that he does not drink alcohol.   Review of Systems CONSTITUTIONAL: per hpi HEENT: Eyes: No visual loss, blurred vision, double vision or yellow sclerae.No hearing loss, sneezing, congestion, runny nose or sore throat.  SKIN: No rash or itching.  CARDIOVASCULAR: per hpi RESPIRATORY: No shortness of breath, cough or sputum.  GASTROINTESTINAL: No anorexia, nausea, vomiting or diarrhea. No abdominal pain or blood.  GENITOURINARY: No burning on urination, no polyuria NEUROLOGICAL: No headache, dizziness, syncope, paralysis, ataxia, numbness or tingling in the extremities. No change in bowel or bladder control.  MUSCULOSKELETAL: No muscle, back pain, joint pain or stiffness.  LYMPHATICS:  No enlarged nodes. No history of splenectomy.  PSYCHIATRIC: No history of depression or anxiety.  ENDOCRINOLOGIC: No reports of sweating, cold or heat intolerance. No polyuria or polydipsia.  Marland Kitchen   Physical Examination Vitals:   03/20/18 0908  BP: 138/78  Pulse: 63  SpO2: 98%   Vitals:   03/20/18 0908  Weight: 242 lb 3.2 oz (109.9 kg)  Height: 6\' 4"  (1.93 m)    Gen: resting comfortably, no acute distress HEENT: no scleral icterus, pupils equal round and reactive, no palptable cervical adenopathy,  CV: RRR, no mr/g, no jvd Resp: Clear to auscultation  bilaterally GI: abdomen is soft, non-tender, non-distended, normal bowel sounds, no hepatosplenomegaly MSK: extremities are warm, no edema.  Skin: warm, no rash Neuro:  no focal deficits Psych: appropriate affect    Assessment and Plan  1. Palpitations/ Fatigue - unclear if any correlation between his PVCs and nonspecific fatigue episodes.  - we will plan for a 30 day event monitor to further evaluate - no further cardiac testing planned at this time. Symptoms not suggestive of ischemia, not suggestive of CHF or valvular heart disease.  - if not able to tolerate beta blocker in the future could consider dilt for his palpitations.      Antoine Poche, M.D.

## 2018-03-20 NOTE — Patient Instructions (Signed)
Your physician recommends that you schedule a follow-up appointment in: PENDING MONITOR RESULTS   Your physician recommends that you continue on your current medications as directed. Please refer to the Current Medication list given to you today.  Your physician has recommended that you wear an event monitor 30 DAYS  Event monitors are medical devices that record the heart's electrical activity. Doctors most often us these monitors to diagnose arrhythmias. Arrhythmias are problems with the speed or rhythm of the heartbeat. The monitor is a small, portable device. You can wear one while you do your normal daily activities. This is usually used to diagnose what is causing palpitations/syncope (passing out).    Thank you for choosing [facility]!!

## 2018-03-25 DIAGNOSIS — R002 Palpitations: Secondary | ICD-10-CM | POA: Diagnosis not present

## 2018-04-07 ENCOUNTER — Telehealth: Payer: Self-pay | Admitting: Cardiology

## 2018-04-07 NOTE — Telephone Encounter (Signed)
Patient said he wore the monitor for 2 weeks and developed a bad irritation where the monitor sat on his chest. Patient said he would be willing to let the irritation clear up first and then reapply a new patch but at this time his skin was too irritated. Patient advised to return the monitor and we could review the results that were available. Verbalized understanding.

## 2018-04-07 NOTE — Telephone Encounter (Signed)
Patient called stating that he had to stop wearing monitor on Saturday.

## 2018-04-24 ENCOUNTER — Telehealth: Payer: Self-pay | Admitting: Cardiology

## 2018-04-24 NOTE — Telephone Encounter (Signed)
Message left on vm that results are not available yet and once they are received, he would be contacted.

## 2018-04-24 NOTE — Telephone Encounter (Signed)
Would like results from monitor  °

## 2018-05-02 ENCOUNTER — Telehealth: Payer: Self-pay | Admitting: *Deleted

## 2018-05-02 NOTE — Telephone Encounter (Signed)
Pt voiced understanding - 3 month f/u scheduled - routed to pcp

## 2018-05-02 NOTE — Telephone Encounter (Signed)
-----   Message from Antoine Poche, MD sent at 05/02/2018 12:23 PM EST ----- Normal heart monitor, no significant abnormalities to explain his symptoms of fatigue. Can can f/u 3 months   Dominga Ferry MD

## 2018-05-22 ENCOUNTER — Ambulatory Visit (INDEPENDENT_AMBULATORY_CARE_PROVIDER_SITE_OTHER): Payer: Commercial Managed Care - PPO | Admitting: Otolaryngology

## 2018-05-22 DIAGNOSIS — H903 Sensorineural hearing loss, bilateral: Secondary | ICD-10-CM

## 2018-05-22 DIAGNOSIS — R42 Dizziness and giddiness: Secondary | ICD-10-CM | POA: Diagnosis not present

## 2018-05-22 DIAGNOSIS — H9313 Tinnitus, bilateral: Secondary | ICD-10-CM | POA: Diagnosis not present

## 2018-06-04 DIAGNOSIS — R42 Dizziness and giddiness: Secondary | ICD-10-CM | POA: Diagnosis not present

## 2018-06-04 DIAGNOSIS — H903 Sensorineural hearing loss, bilateral: Secondary | ICD-10-CM | POA: Diagnosis not present

## 2018-07-18 ENCOUNTER — Ambulatory Visit: Payer: Medicare Other | Admitting: Cardiology

## 2018-07-18 ENCOUNTER — Telehealth: Payer: Self-pay | Admitting: *Deleted

## 2018-07-18 NOTE — Telephone Encounter (Signed)
attempted to reach pt multiple times regarding 07/18/18 appt with Dr Wyline Mood with no return phone call. With current pandemic pt appt canceled and will mail letter to contact office for appt - also placed recall for June

## 2020-02-11 DIAGNOSIS — Z0001 Encounter for general adult medical examination with abnormal findings: Secondary | ICD-10-CM | POA: Diagnosis not present

## 2020-02-11 DIAGNOSIS — Z6827 Body mass index (BMI) 27.0-27.9, adult: Secondary | ICD-10-CM | POA: Diagnosis not present

## 2020-02-11 DIAGNOSIS — R5383 Other fatigue: Secondary | ICD-10-CM | POA: Diagnosis not present

## 2020-02-11 DIAGNOSIS — I10 Essential (primary) hypertension: Secondary | ICD-10-CM | POA: Diagnosis not present

## 2020-02-11 DIAGNOSIS — Z1212 Encounter for screening for malignant neoplasm of rectum: Secondary | ICD-10-CM | POA: Diagnosis not present

## 2020-02-11 DIAGNOSIS — F17210 Nicotine dependence, cigarettes, uncomplicated: Secondary | ICD-10-CM | POA: Diagnosis not present

## 2020-04-20 DIAGNOSIS — G5603 Carpal tunnel syndrome, bilateral upper limbs: Secondary | ICD-10-CM | POA: Diagnosis not present

## 2020-04-20 DIAGNOSIS — M1812 Unilateral primary osteoarthritis of first carpometacarpal joint, left hand: Secondary | ICD-10-CM | POA: Diagnosis not present

## 2020-05-23 DIAGNOSIS — G5602 Carpal tunnel syndrome, left upper limb: Secondary | ICD-10-CM | POA: Diagnosis not present

## 2020-05-23 DIAGNOSIS — Z4789 Encounter for other orthopedic aftercare: Secondary | ICD-10-CM | POA: Diagnosis not present

## 2020-06-06 DIAGNOSIS — M25632 Stiffness of left wrist, not elsewhere classified: Secondary | ICD-10-CM | POA: Diagnosis not present

## 2020-08-30 DIAGNOSIS — R109 Unspecified abdominal pain: Secondary | ICD-10-CM | POA: Diagnosis not present

## 2020-08-30 DIAGNOSIS — Z6826 Body mass index (BMI) 26.0-26.9, adult: Secondary | ICD-10-CM | POA: Diagnosis not present

## 2020-08-30 DIAGNOSIS — R059 Cough, unspecified: Secondary | ICD-10-CM | POA: Diagnosis not present

## 2020-09-06 DIAGNOSIS — R1013 Epigastric pain: Secondary | ICD-10-CM | POA: Diagnosis not present

## 2020-09-06 DIAGNOSIS — R109 Unspecified abdominal pain: Secondary | ICD-10-CM | POA: Diagnosis not present

## 2021-02-21 DIAGNOSIS — Z1211 Encounter for screening for malignant neoplasm of colon: Secondary | ICD-10-CM | POA: Diagnosis not present

## 2021-02-21 DIAGNOSIS — Z1212 Encounter for screening for malignant neoplasm of rectum: Secondary | ICD-10-CM | POA: Diagnosis not present

## 2021-03-24 DIAGNOSIS — K635 Polyp of colon: Secondary | ICD-10-CM | POA: Diagnosis not present

## 2021-03-24 DIAGNOSIS — Z1211 Encounter for screening for malignant neoplasm of colon: Secondary | ICD-10-CM | POA: Diagnosis not present

## 2021-03-28 DIAGNOSIS — Z1211 Encounter for screening for malignant neoplasm of colon: Secondary | ICD-10-CM | POA: Diagnosis not present

## 2021-03-28 DIAGNOSIS — K635 Polyp of colon: Secondary | ICD-10-CM | POA: Diagnosis not present

## 2021-04-26 DIAGNOSIS — D125 Benign neoplasm of sigmoid colon: Secondary | ICD-10-CM | POA: Diagnosis not present

## 2021-05-09 DIAGNOSIS — I10 Essential (primary) hypertension: Secondary | ICD-10-CM | POA: Diagnosis not present

## 2021-05-09 DIAGNOSIS — R519 Headache, unspecified: Secondary | ICD-10-CM | POA: Diagnosis not present

## 2021-05-09 DIAGNOSIS — Z6828 Body mass index (BMI) 28.0-28.9, adult: Secondary | ICD-10-CM | POA: Diagnosis not present

## 2021-05-09 DIAGNOSIS — M542 Cervicalgia: Secondary | ICD-10-CM | POA: Diagnosis not present

## 2021-05-09 DIAGNOSIS — G8929 Other chronic pain: Secondary | ICD-10-CM | POA: Diagnosis not present

## 2021-06-09 DIAGNOSIS — R519 Headache, unspecified: Secondary | ICD-10-CM | POA: Diagnosis not present

## 2021-07-04 ENCOUNTER — Encounter: Payer: Self-pay | Admitting: Neurology

## 2021-09-12 ENCOUNTER — Other Ambulatory Visit (HOSPITAL_COMMUNITY): Payer: Self-pay

## 2021-09-12 MED ORDER — METOPROLOL SUCCINATE ER 25 MG PO TB24
25.0000 mg | ORAL_TABLET | Freq: Every day | ORAL | 6 refills | Status: DC
  Filled 2021-09-12: qty 30, 30d supply, fill #0
  Filled 2021-10-05: qty 30, 30d supply, fill #1

## 2021-09-12 MED ORDER — LOSARTAN POTASSIUM 100 MG PO TABS
ORAL_TABLET | ORAL | 6 refills | Status: DC
  Filled 2021-09-12: qty 30, 30d supply, fill #0
  Filled 2021-10-05: qty 30, 30d supply, fill #1

## 2021-09-14 ENCOUNTER — Other Ambulatory Visit (HOSPITAL_COMMUNITY): Payer: Self-pay

## 2021-09-14 MED ORDER — METOPROLOL SUCCINATE ER 25 MG PO TB24
ORAL_TABLET | ORAL | 3 refills | Status: DC
  Filled 2021-11-03: qty 90, 90d supply, fill #0

## 2021-09-14 MED ORDER — LOSARTAN POTASSIUM 100 MG PO TABS
ORAL_TABLET | ORAL | 3 refills | Status: DC
  Filled 2021-09-14 – 2021-11-03 (×2): qty 90, 90d supply, fill #0

## 2021-09-20 ENCOUNTER — Other Ambulatory Visit: Payer: Self-pay

## 2021-09-20 ENCOUNTER — Encounter (HOSPITAL_COMMUNITY): Payer: Self-pay | Admitting: Emergency Medicine

## 2021-09-20 ENCOUNTER — Emergency Department (HOSPITAL_COMMUNITY)
Admission: EM | Admit: 2021-09-20 | Discharge: 2021-09-20 | Disposition: A | Discharge status: Home / Self Care | Payer: 59 | Attending: Emergency Medicine | Admitting: Emergency Medicine

## 2021-09-20 ENCOUNTER — Emergency Department (HOSPITAL_COMMUNITY): Payer: 59

## 2021-09-20 DIAGNOSIS — I10 Essential (primary) hypertension: Secondary | ICD-10-CM | POA: Insufficient documentation

## 2021-09-20 DIAGNOSIS — R0602 Shortness of breath: Secondary | ICD-10-CM | POA: Diagnosis not present

## 2021-09-20 DIAGNOSIS — R42 Dizziness and giddiness: Secondary | ICD-10-CM | POA: Diagnosis not present

## 2021-09-20 DIAGNOSIS — Z79899 Other long term (current) drug therapy: Secondary | ICD-10-CM | POA: Diagnosis not present

## 2021-09-20 DIAGNOSIS — R531 Weakness: Secondary | ICD-10-CM | POA: Insufficient documentation

## 2021-09-20 DIAGNOSIS — R001 Bradycardia, unspecified: Secondary | ICD-10-CM | POA: Diagnosis not present

## 2021-09-20 DIAGNOSIS — R002 Palpitations: Secondary | ICD-10-CM | POA: Diagnosis not present

## 2021-09-20 DIAGNOSIS — R5383 Other fatigue: Secondary | ICD-10-CM | POA: Insufficient documentation

## 2021-09-20 LAB — CBC
HCT: 47.1 % (ref 39.0–52.0)
Hemoglobin: 15.4 g/dL (ref 13.0–17.0)
MCH: 30.3 pg (ref 26.0–34.0)
MCHC: 32.7 g/dL (ref 30.0–36.0)
MCV: 92.7 fL (ref 80.0–100.0)
Platelets: 213 10*3/uL (ref 150–400)
RBC: 5.08 MIL/uL (ref 4.22–5.81)
RDW: 12.6 % (ref 11.5–15.5)
WBC: 6.6 10*3/uL (ref 4.0–10.5)
nRBC: 0 % (ref 0.0–0.2)

## 2021-09-20 LAB — COMPREHENSIVE METABOLIC PANEL
ALT: 19 U/L (ref 0–44)
AST: 24 U/L (ref 15–41)
Albumin: 4.3 g/dL (ref 3.5–5.0)
Alkaline Phosphatase: 68 U/L (ref 38–126)
Anion gap: 2 — ABNORMAL LOW (ref 5–15)
BUN: 10 mg/dL (ref 6–20)
CO2: 31 mmol/L (ref 22–32)
Calcium: 9.5 mg/dL (ref 8.9–10.3)
Chloride: 108 mmol/L (ref 98–111)
Creatinine, Ser: 0.82 mg/dL (ref 0.61–1.24)
GFR, Estimated: >60 mL/min (ref >60)
Glucose, Bld: 106 mg/dL — ABNORMAL HIGH (ref 70–99)
Potassium: 4.4 mmol/L (ref 3.5–5.1)
Sodium: 141 mmol/L (ref 135–145)
Total Bilirubin: 1.1 mg/dL (ref 0.3–1.2)
Total Protein: 7.8 g/dL (ref 6.5–8.1)

## 2021-09-20 LAB — TROPONIN I (HIGH SENSITIVITY): Troponin I (High Sensitivity): 3 ng/L (ref <18)

## 2021-09-20 LAB — MAGNESIUM: Magnesium: 2.3 mg/dL (ref 1.7–2.4)

## 2021-09-20 NOTE — ED Notes (Signed)
From triage to exam room in w/c. Endorses intermittent palpitations and dizziness. Pt alert, NAD, calm, interactive, resps e/u, speaking in clear complete sentences. Steady gait from w/c to room 9 b/r. Wife at [facility].

## 2021-09-20 NOTE — ED Notes (Signed)
Pt ambulated around the ER, Pts O2 was 99-100% Pts pulse was 73

## 2021-09-20 NOTE — ED Triage Notes (Signed)
Pt here for evaluation of bradycardia that started around 7 am , lowest at home was HR-43, currently feels weak and lightheaded.

## 2021-09-20 NOTE — Discharge Instructions (Signed)
Your lab tests and exam are reassuring today.  You will need follow-up with Dr. Wyline Mood as discussed.  I will let you decide whether you want to continue taking your as needed metoprolol, although this is a very small dose it can certainly be affecting your heart rate.

## 2021-09-20 NOTE — ED Provider Notes (Signed)
[facility] EMERGENCY DEPARTMENT Provider Note   CSN: [id number] Arrival date & time: 09/20/21  1214     History  Chief Complaint  Patient presents with   Bradycardia    Stanley Lee is a 61 y.o. male with a history including hypertension, infrequent PVCs for which she has seen Dr. Harl Bowie in the past presenting for evaluation of symptomatic bradycardia.  He describes having this problem for at least the last several years, but states and tell today his symptoms which include generalized weakness and fatigue would be transient, perhaps lasting for an hour or less, presenting today because he has felt poorly since yesterday and started monitoring his pulses on his home machine and getting numbers ranging from 43 to the low 50s.  He denies chest pain, shortness of breath, palpitations, diaphoresis.  He denies peripheral edema, cough orthopnea.  He has found no alleviators for symptoms.  He is on losartan, also takes metoprolol 12.5 mg prn for episodic pvc's. He states he takes this medicine about 3 times per week on average.    Additionally he reports being evaluated for the episodic dizziness by neurology, reporting having had tilt table testing and MRI within the past 12 months and negative.  He has also had thyroid function tests by his pcp, negative.    The history is provided by the patient.      Home Medications Prior to Admission medications   Medication Sig Start Date End Date Taking? Authorizing Provider  CALCIUM-MAGNESIUM-ZINC PO Take 1 tablet by mouth. Occasionally    [provider]  Coenzyme Q10 (COQ-10) 200 MG CAPS Take 200 mg by mouth. Takes 2-3 x per week    [provider]  losartan (COZAAR) 100 MG tablet Take 1 tablet by mouth daily. 03/15/18   [provider]  losartan (COZAAR) 100 MG tablet Take 1 tablet by mouth once daily 09/12/21     losartan (COZAAR) 100 MG tablet TAKE 1 TABLET BY MOUTH EVERY DAY FOR HIGH BLOOD PRESSURE 09/14/21      metoprolol succinate (TOPROL-XL) 25 MG 24 hr tablet Take 0.5 tablets by mouth daily. Does take 0.5 twice a day occasionally. 03/15/18   [provider]  metoprolol succinate (TOPROL-XL) 25 MG 24 hr tablet TAKE ONE TABLET BY MOUTH EVERY DAY 09/12/21     metoprolol succinate (TOPROL-XL) 25 MG 24 hr tablet TAKE ONE TABLET BY MOUTH EVERY DAY 09/14/21     Omega-3 Fatty Acids (FISH OIL) 1000 MG CAPS Take 2,000 mg by mouth. Takes 2-3 x per week    [provider]  SELENIUM PO Take 1 tablet by mouth. Occasionally    [provider]  UNABLE TO FIND Med Name: CPAP    [provider]      Allergies    Accupril [quinapril hcl]    Review of Systems   Review of Systems  Constitutional:  Positive for fatigue. Negative for fever.  HENT:  Negative for congestion and sore throat.   Eyes: Negative.   Respiratory:  Negative for chest tightness and shortness of breath.   Cardiovascular:  Positive for palpitations. Negative for chest pain.  Gastrointestinal:  Negative for abdominal pain and nausea.  Genitourinary: Negative.   Musculoskeletal:  Negative for arthralgias, joint swelling and neck pain.  Skin: Negative.  Negative for rash and wound.  Neurological:  Negative for dizziness, weakness, light-headedness, numbness and headaches.  Psychiatric/Behavioral: Negative.     Physical Exam Updated Vital Signs BP (!) 151/84   Pulse  83   Temp 97.8 F (36.6 C) (Oral)   Resp 18   Ht 6\' 4"  (1.93 m)   Wt 100.7 kg   SpO2 90%   BMI 27.02 kg/m  Physical Exam Vitals and nursing note reviewed.  Constitutional:      Appearance: He is well-developed.  HENT:     Head: Normocephalic and atraumatic.  Eyes:     Conjunctiva/sclera: Conjunctivae normal.  Cardiovascular:     Rate and Rhythm: Regular rhythm. Bradycardia present.     Heart sounds: Normal heart sounds. No murmur heard. Pulmonary:     Effort: Pulmonary effort is normal.     Breath sounds: Normal breath sounds. No  wheezing.  Abdominal:     General: Bowel sounds are normal.     Palpations: Abdomen is soft.     Tenderness: There is no abdominal tenderness.  Musculoskeletal:        General: Normal range of motion.     Cervical back: Normal range of motion.  Skin:    General: Skin is warm and dry.  Neurological:     Mental Status: He is alert.    ED Results / Procedures / Treatments   Labs (all labs ordered are listed, but only abnormal results are displayed) Labs Reviewed  COMPREHENSIVE METABOLIC PANEL - Abnormal; Notable for the following components:      Result Value   Glucose, Bld 106 (*)    Anion gap 2 (*)    All other components within normal limits  CBC  MAGNESIUM  TROPONIN I (HIGH SENSITIVITY)    EKG EKG Interpretation  Date/Time:  Wednesday September 20 2021 12:49:19 EDT Ventricular Rate:  55 PR Interval:  148 QRS Duration: 100 QT Interval:  420 QTC Calculation: 401 R Axis:   66 Text Interpretation: Sinus bradycardia Incomplete right bundle branch block Borderline ECG Confirmed by Sherwood Gambler [phone]) on 09/20/2021 3:00:34 PM  Radiology DG Chest Portable 1 View  Result Date: 09/20/2021 CLINICAL DATA:  Palpitations.  Shortness of breath.  Bradycardia. EXAM: PORTABLE CHEST 1 VIEW COMPARISON:  11/19/2015 FINDINGS: The heart size and mediastinal contours are within normal limits. Both lungs are clear. The visualized skeletal structures are unremarkable. IMPRESSION: No active disease. Electronically Signed   By: Nelson Chimes M.D.   On: 09/20/2021 13:40    Procedures Procedures    Medications Ordered in ED Medications - No data to display  ED Course/ Medical Decision Making/ A&P                           Medical Decision Making Patient with generalized fatigue and weakness with recognized bradycardia.  He has had the symptoms intermittently for greater than 1 year but has been more transient than currently, the symptom starting yesterday evening and persisted today.  He denies  chest pain, denies peripheral edema, he does have history of PVCs for which he uses as needed metoprolol, but states he only takes 12.5 mg tablet on average 3 times per week, his last dose was taken yesterday evening.  His work-up today is reassuring, he has no electrolyte abnormalities, his EKG is stable, bradycardia at 55.  He remained on the monitor during his ED stay and is pulse rates ranged from 45-55 on average.  He was ambulated in the department and his pulse rate improved to 83.  Amount and/or Complexity of Data Reviewed Labs: ordered.    Details: Reviewed, no electrolyte light abnormalities, normal troponin. Discussion of  management or test interpretation with external provider(s): Discussed patient with Dr. Johnsie Cancel of cardiology.  No further work-up indicated today, plus or minus metoprolol depending on how bothersome the PVCs are.  He does recommend close follow-up with Dr. Harl Bowie stating he would benefit from a cardiac stress test to ensure that his heart rate appropriately response to exercise.  Discussed this plan with patient and wife at bedside and they are agreeable to this plan.  Risk Prescription drug management.           Final Clinical Impression(s) / ED Diagnoses Final diagnoses:  Bradycardia  Other fatigue    Rx / DC Orders ED Discharge Orders     None         Landis Martins 09/20/21 1553    Sherwood Gambler, MD 09/21/21 1538

## 2021-10-05 ENCOUNTER — Other Ambulatory Visit (HOSPITAL_COMMUNITY): Payer: Self-pay

## 2021-10-06 ENCOUNTER — Other Ambulatory Visit (HOSPITAL_COMMUNITY): Payer: Self-pay

## 2021-10-09 ENCOUNTER — Other Ambulatory Visit (HOSPITAL_COMMUNITY): Payer: Self-pay

## 2021-10-27 ENCOUNTER — Ambulatory Visit: Payer: Medicare Other | Admitting: Neurology

## 2021-11-03 ENCOUNTER — Other Ambulatory Visit (HOSPITAL_COMMUNITY): Payer: Self-pay

## 2021-11-07 ENCOUNTER — Encounter: Payer: Self-pay | Admitting: Cardiology

## 2021-11-07 ENCOUNTER — Ambulatory Visit (INDEPENDENT_AMBULATORY_CARE_PROVIDER_SITE_OTHER): Payer: 59 | Admitting: Cardiology

## 2021-11-07 ENCOUNTER — Other Ambulatory Visit (HOSPITAL_COMMUNITY): Payer: Self-pay

## 2021-11-07 VITALS — BP 140/88 | HR 70 | Ht 76.0 in | Wt 220.4 lb

## 2021-11-07 DIAGNOSIS — I10 Essential (primary) hypertension: Secondary | ICD-10-CM

## 2021-11-07 DIAGNOSIS — R002 Palpitations: Secondary | ICD-10-CM | POA: Diagnosis not present

## 2021-11-07 MED ORDER — AMLODIPINE BESYLATE 10 MG PO TABS
10.0000 mg | ORAL_TABLET | Freq: Every day | ORAL | 6 refills | Status: DC
  Filled 2021-11-07: qty 30, 30d supply, fill #0
  Filled 2021-12-06: qty 30, 30d supply, fill #1

## 2021-11-07 NOTE — Progress Notes (Signed)
Clinical Summary Stanley Lee is a 61 y.o.male seen today for follow up of the following medical problems.   1. Fatigue/Palpitations - Long history of palpitations and PVCs.  - previous workup in [place], about 8 years ago. Had echocardiogram, chemical stress test, heart monitor x 7 days. He reports negative workup other than being told he had some PVCs     - Can have palpitations at times. Variable in duration, no clear pattern. Better with toprol, but did have significant fatigue after first starting but tolerating 12.'5mg'$  bid dosing.  - occasional unexplained fatigue at times. No palpitations with these episodes. Can get diaphoretic, dizzy.  - has weaned caffeine, no EtoH. - can have some vertigo.  - denies any chest pain.    Prior monitor - significant skin irritation from monitor      - 09/2021 ER visit with bradycardia, fatigue - EKG sinus brady 55. From ER notes with ambulation HR up to 73 - he is on toprol  - home EKG monitor low HRs, mid 40s.  - some lightheadedness at times, no dizziness. When he has symptoms checks vitals and reports they are normal, HRs in 70s.  - some fatigue. Episodes of legs feeling rubbery, weak/fatigued. VItals are fine during that time. Episodes up to 5 minutes, longest 3-4 days. Light pressure in head.  - rare palpitations, takes toprol prn. Had taken toprol.      03/2018 30 day monitor 30 day event monitor. Data is available from only 56% of planned monitored time Min HR 44, Max HR 115, Avg HR 67. Min HR in early AM hours presumably while sleeping No symptoms reported Available tracings show sinus rhythm. No significant arrhythmias  Past Medical History:  Diagnosis Date   Heart palpitations    Hypertension    Peroneal nerve injury    PVC's (premature ventricular contractions)      Allergies  Allergen Reactions   Accupril [Quinapril Hcl] Other (See Comments)    syncope     Current Outpatient Medications  Medication  Sig Dispense Refill   CALCIUM-MAGNESIUM-ZINC PO Take 1 tablet by mouth. Occasionally     Coenzyme Q10 (COQ-10) 200 MG CAPS Take 200 mg by mouth. Takes 2-3 x per week     losartan (COZAAR) 100 MG tablet Take 1 tablet by mouth daily.  1   losartan (COZAAR) 100 MG tablet TAKE 1 TABLET BY MOUTH EVERY DAY FOR HIGH BLOOD PRESSURE 90 tablet 3   metoprolol succinate (TOPROL-XL) 25 MG 24 hr tablet Take 0.5 tablets by mouth daily. Does take 0.5 twice a day occasionally.  0   metoprolol succinate (TOPROL-XL) 25 MG 24 hr tablet TAKE 1 TABLET BY MOUTH EVERY DAY 90 tablet 3   Omega-3 Fatty Acids (FISH OIL) 1000 MG CAPS Take 2,000 mg by mouth. Takes 2-3 x per week     SELENIUM PO Take 1 tablet by mouth. Occasionally     UNABLE TO FIND Med Name: CPAP     No current facility-administered medications for this visit.     Past Surgical History:  Procedure Laterality Date   BACK SURGERY     CHOLECYSTECTOMY     COLONOSCOPY W/ POLYPECTOMY     KNEE ARTHROCENTESIS     right shoulder surgery       Allergies  Allergen Reactions   Accupril [Quinapril Hcl] Other (See Comments)    syncope      No family history on file.   Social History Stanley Lee  reports that he has been smoking cigarettes. He has a 24.00 pack-year smoking history. He has never used smokeless tobacco. Stanley Lee reports no history of alcohol use.   Review of Systems CONSTITUTIONAL: No weight loss, fever, chills, weakness or fatigue.  HEENT: Eyes: No visual loss, blurred vision, double vision or yellow sclerae.No hearing loss, sneezing, congestion, runny nose or sore throat.  SKIN: No rash or itching.  CARDIOVASCULAR: per hpi RESPIRATORY: No shortness of breath, cough or sputum.  GASTROINTESTINAL: No anorexia, nausea, vomiting or diarrhea. No abdominal pain or blood.  GENITOURINARY: No burning on urination, no polyuria NEUROLOGICAL: No headache, dizziness, syncope, paralysis, ataxia, numbness or tingling in the extremities. No  change in bowel or bladder control.  MUSCULOSKELETAL: No muscle, back pain, joint pain or stiffness.  LYMPHATICS: No enlarged nodes. No history of splenectomy.  PSYCHIATRIC: No history of depression or anxiety.  ENDOCRINOLOGIC: No reports of sweating, cold or heat intolerance. No polyuria or polydipsia.  Marland Kitchen   Physical Examination Today's Vitals   11/07/21 1307  BP: 140/88  Pulse: 70  SpO2: 98%  Weight: 220 lb 6.4 oz (100 kg)  Height: '6\' 4"'$  (1.93 m)   Body mass index is 26.83 kg/m.  Gen: resting comfortably, no acute distress HEENT: no scleral icterus, pupils equal round and reactive, no palptable cervical adenopathy,  CV: RRR, no m/rg, no jvd Resp: Clear to auscultation bilaterally GI: abdomen is soft, non-tender, non-distended, normal bowel sounds, no hepatosplenomegaly MSK: extremities are warm, no edema.  Skin: warm, no rash Neuro:  no focal deficits Psych: appropriate affect   Diagnostic Studies 03/2018 30 day monitor 30 day event monitor. Data is available from only 56% of planned monitored time Min HR 44, Max HR 115, Avg HR 67. Min HR in early AM hours presumably while sleeping No symptoms reported Available tracings show sinus rhythm. No significant arrhythmias    Assessment and Plan  1. Palpitations/ Fatigue - continue current meds  2. HTN - d/c losartan, start norvasc '10mg'$  daily.      Arnoldo Lenis, M.D.,

## 2021-11-07 NOTE — Patient Instructions (Addendum)
Medication Instructions:  Stop Losartan (Cozaar) Begin Norvasc 10mg  daily  Continue all other medications.     Labwork: none  Testing/Procedures: none  Follow-Up: 6 months   Any Other Special Instructions Will Be Listed Below (If Applicable).   If you need a refill on your cardiac medications before your next appointment, please call your pharmacy.

## 2021-12-06 ENCOUNTER — Other Ambulatory Visit (HOSPITAL_COMMUNITY): Payer: Self-pay

## 2021-12-07 ENCOUNTER — Other Ambulatory Visit (HOSPITAL_COMMUNITY): Payer: Self-pay

## 2022-01-19 ENCOUNTER — Telehealth: Payer: Self-pay | Admitting: Cardiology

## 2022-01-19 ENCOUNTER — Other Ambulatory Visit (HOSPITAL_COMMUNITY): Payer: Self-pay

## 2022-01-19 MED ORDER — LOSARTAN POTASSIUM 100 MG PO TABS
100.0000 mg | ORAL_TABLET | Freq: Every day | ORAL | 3 refills | Status: DC
Start: 2022-01-19 — End: 2023-01-28
  Filled 2022-01-19 – 2022-02-05 (×2): qty 90, 90d supply, fill #0
  Filled 2022-05-02 – 2022-05-07 (×2): qty 90, 90d supply, fill #1
  Filled 2022-08-08: qty 90, 90d supply, fill #2
  Filled 2022-11-06: qty 90, 90d supply, fill #3

## 2022-01-19 NOTE — Telephone Encounter (Signed)
Pt c/o medication issue:  1. Name of Medication: Amlodipine  2. How are you currently taking this medication (dosage and times per day)?   3. Are you having a reaction (difficulty breathing--STAT)? He said the only issue with the Amlodipine was the swelling in his leg  4. What is your medication issue? Patient says he is going back to his :Losartan 100 mg- patient says he will need a new prescription called in for this please.    *STAT* If patient is at the pharmacy, call can be transferred to refill team.   1. Which medications need to be refilled? (please list name of each medication and dose if known)  a new prescription for Losartan   2. Which pharmacy/location (including street and city if local pharmacy) is medication to be sent to? [place], [place]  3. Do they need a 30 day or 90 day supply? 90 days and refills

## 2022-01-19 NOTE — Telephone Encounter (Signed)
Will forward to provider for approval as Losartan was d/c'd at last OV with Dr. Harl Bowie.

## 2022-01-19 NOTE — Telephone Encounter (Signed)
Left message to return call 

## 2022-01-19 NOTE — Telephone Encounter (Signed)
Ok to stop norvac and go back to losartan 100mg  daily  Zandra Abts MD

## 2022-01-19 NOTE — Telephone Encounter (Signed)
Patient notified and verbalized understanding.  New prescription sent to [facility] now.

## 2022-01-29 ENCOUNTER — Other Ambulatory Visit (HOSPITAL_COMMUNITY): Payer: Self-pay

## 2022-01-29 NOTE — Telephone Encounter (Signed)
*  STAT* If patient is at the pharmacy, call can be transferred to refill team.   1. Which medications need to be refilled? (please list name of each medication and dose if known)   losartan (COZAAR) 100 MG tablet  2. Which pharmacy/location (including street and city if local pharmacy) is medication to be sent to?  [place]  3. Do they need a 30 day or 90 day supply? 90 day   Patient stated he still has not received this medication and would like the prescription re-sent to the [facility].

## 2022-01-29 NOTE — Telephone Encounter (Signed)
01/19/2022 faxed to [facility]

## 2022-02-05 ENCOUNTER — Other Ambulatory Visit (HOSPITAL_COMMUNITY): Payer: Self-pay

## 2022-02-27 ENCOUNTER — Other Ambulatory Visit (HOSPITAL_COMMUNITY): Payer: Self-pay | Admitting: Family Medicine

## 2022-02-27 DIAGNOSIS — F17210 Nicotine dependence, cigarettes, uncomplicated: Secondary | ICD-10-CM

## 2022-02-27 DIAGNOSIS — F17200 Nicotine dependence, unspecified, uncomplicated: Secondary | ICD-10-CM

## 2022-03-27 ENCOUNTER — Ambulatory Visit (HOSPITAL_COMMUNITY)
Admission: RE | Admit: 2022-03-27 | Discharge: 2022-03-27 | Disposition: A | Discharge status: Home / Self Care | Payer: 59 | Source: Ambulatory Visit | Attending: Family Medicine | Admitting: Family Medicine

## 2022-03-27 DIAGNOSIS — F17210 Nicotine dependence, cigarettes, uncomplicated: Secondary | ICD-10-CM | POA: Diagnosis not present

## 2022-03-27 DIAGNOSIS — F17200 Nicotine dependence, unspecified, uncomplicated: Secondary | ICD-10-CM | POA: Insufficient documentation

## 2022-04-24 ENCOUNTER — Encounter: Payer: Self-pay | Admitting: Cardiology

## 2022-04-24 ENCOUNTER — Encounter: Payer: Self-pay | Admitting: *Deleted

## 2022-04-24 ENCOUNTER — Ambulatory Visit: Payer: 59 | Attending: Cardiology | Admitting: Cardiology

## 2022-04-24 VITALS — BP 140/78 | HR 68 | Ht 76.0 in | Wt 230.6 lb

## 2022-04-24 DIAGNOSIS — R42 Dizziness and giddiness: Secondary | ICD-10-CM

## 2022-04-24 DIAGNOSIS — I493 Ventricular premature depolarization: Secondary | ICD-10-CM

## 2022-04-24 MED ORDER — ASPIRIN 81 MG PO TBEC: 81.0000 mg | DELAYED_RELEASE_TABLET | Freq: Every day | ORAL | Status: AC

## 2022-04-24 NOTE — Progress Notes (Signed)
Clinical Summary Stanley Lee is a 62 y.o.male seen today for follow up of the following medical problems.   1. Fatigue/Palpitations - Long history of palpitations and PVCs.  - previous workup in [place], about 8 years ago. Had echocardiogram, chemical stress test, heart monitor x 7 days. He reports negative workup other than being told he had some PVCs    Prior monitor - significant skin irritation from monitor    - 09/2021 ER visit with bradycardia, fatigue - EKG sinus brady 55. From ER notes with ambulation HR up to 73 - he is on toprol      03/2018 30 day monitor 30 day event monitor. Data is available from only 56% of planned monitored time Min HR 44, Max HR 115, Avg HR 67. Min HR in early AM hours presumably while sleeping No symptoms reported Available tracings show sinus rhythm. No significant arrhythmias   -lightheadeness, constant 24 hours throughout the day. Not positional. Checks vitals and typically normal. . Has had prior vertigo. Some times associated with eye movement.        2. HTN - last visit stopped losartan, started norvasc  3. Atherosclerosis - aortic atherosclerosis, 3 vessel CAD.  - no chest pains, no SOB/DOE - hauls very heavy wagon during yard work, tolerates well - labs followed by pcp - ongoing smoker     Past Medical History:  Diagnosis Date   Heart palpitations    Hypertension    Peroneal nerve injury    PVC's (premature ventricular contractions)      Allergies  Allergen Reactions   Accupril [Quinapril Hcl] Other (See Comments)    syncope     Current Outpatient Medications  Medication Sig Dispense Refill   Coenzyme Q10 (COQ-10) 200 MG CAPS Take 200 mg by mouth. Takes 2-3 x per week     losartan (COZAAR) 100 MG tablet Take 1 tablet (100 mg total) by mouth daily. 90 tablet 3   metoprolol succinate (TOPROL-XL) 25 MG 24 hr tablet Take 0.5 tablets by mouth daily. Does take 0.5 twice a day occasionally.  0   Omega-3  Fatty Acids (FISH OIL) 1000 MG CAPS Take 2,000 mg by mouth. Takes 2-3 x per week     UNABLE TO FIND Med Name: CPAP     No current facility-administered medications for this visit.     Past Surgical History:  Procedure Laterality Date   BACK SURGERY     CHOLECYSTECTOMY     COLONOSCOPY W/ POLYPECTOMY     KNEE ARTHROCENTESIS     right shoulder surgery       Allergies  Allergen Reactions   Accupril [Quinapril Hcl] Other (See Comments)    syncope      Family History  Problem Relation Age of Onset   AAA (abdominal aortic aneurysm) Mother    Emphysema Father    Hypertension Sister    Heart disease Sister        pacemaker   Stroke Sister    Suicidality Sister    Alcohol abuse Sister    Valvular heart disease Maternal Grandfather      Social History Stanley Lee reports that he has been smoking cigarettes. He has a 24.00 pack-year smoking history. He has never used smokeless tobacco. Stanley Lee reports no history of alcohol use.   Review of Systems CONSTITUTIONAL: No weight loss, fever, chills, weakness or fatigue.  HEENT: Eyes: No visual loss, blurred vision, double vision or yellow sclerae.No hearing loss,  sneezing, congestion, runny nose or sore throat.  SKIN: No rash or itching.  CARDIOVASCULAR: per hpi RESPIRATORY: No shortness of breath, cough or sputum.  GASTROINTESTINAL: No anorexia, nausea, vomiting or diarrhea. No abdominal pain or blood.  GENITOURINARY: No burning on urination, no polyuria NEUROLOGICAL: No headache, dizziness, syncope, paralysis, ataxia, numbness or tingling in the extremities. No change in bowel or bladder control.  MUSCULOSKELETAL: No muscle, back pain, joint pain or stiffness.  LYMPHATICS: No enlarged nodes. No history of splenectomy.  PSYCHIATRIC: No history of depression or anxiety.  ENDOCRINOLOGIC: No reports of sweating, cold or heat intolerance. No polyuria or polydipsia.  Marland Kitchen   Physical Examination Today's Vitals   04/24/22 0957   BP: (!) 140/78  Pulse: 68  SpO2: 97%  Weight: 230 lb 9.6 oz (104.6 kg)  Height: 6\' 4"  (1.93 m)   Body mass index is 28.07 kg/m.  Gen: resting comfortably, no acute distress HEENT: no scleral icterus, pupils equal round and reactive, no palptable cervical adenopathy,  CV: RRR, no m/r/g, no jvd Resp: Clear to auscultation bilaterally GI: abdomen is soft, non-tender, non-distended, normal bowel sounds, no hepatosplenomegaly MSK: extremities are warm, no edema.  Skin: warm, no rash Neuro:  no focal deficits Psych: appropriate affect   Diagnostic Studies 03/2018 30 day monitor 30 day event monitor. Data is available from only 56% of planned monitored time Min HR 44, Max HR 115, Avg HR 67. Min HR in early AM hours presumably while sleeping No symptoms reported Available tracings show sinus rhythm. No significant arrhythmias      Assessment and Plan  1. Dizziness.  - symptoms not consistent with cardiac etiology, prior cardiac testing has not shown any evidence of specific cause - will refer to neuro for evaluation.    2.PVCs - mild symptoms, continue prn toprol. Daily led to low HRs that have resolved with just prn use.    Stanley Lee, M.D.

## 2022-04-24 NOTE — Patient Instructions (Signed)
Medication Instructions:  Begin Aspirin 81mg  daily  Continue all other medications.     Labwork: none  Testing/Procedures: none  Follow-Up: 6 months   Any Other Special Instructions Will Be Listed Below (If Applicable). You have been referred to NEUROLOGY    If you need a refill on your cardiac medications before your next appointment, please call your pharmacy.

## 2022-04-27 ENCOUNTER — Encounter: Payer: Self-pay | Admitting: Neurology

## 2022-05-02 ENCOUNTER — Other Ambulatory Visit (HOSPITAL_COMMUNITY): Payer: Self-pay

## 2022-05-07 ENCOUNTER — Other Ambulatory Visit (HOSPITAL_COMMUNITY): Payer: Self-pay

## 2022-05-09 DIAGNOSIS — M180 Bilateral primary osteoarthritis of first carpometacarpal joints: Secondary | ICD-10-CM | POA: Diagnosis not present

## 2022-05-09 DIAGNOSIS — F17200 Nicotine dependence, unspecified, uncomplicated: Secondary | ICD-10-CM | POA: Diagnosis not present

## 2022-05-09 DIAGNOSIS — G5603 Carpal tunnel syndrome, bilateral upper limbs: Secondary | ICD-10-CM | POA: Diagnosis not present

## 2022-05-09 DIAGNOSIS — G5601 Carpal tunnel syndrome, right upper limb: Secondary | ICD-10-CM | POA: Diagnosis not present

## 2022-05-15 ENCOUNTER — Ambulatory Visit: Payer: 59 | Admitting: Neurology

## 2022-05-23 ENCOUNTER — Ambulatory Visit: Payer: 59 | Admitting: Neurology

## 2022-06-01 NOTE — Progress Notes (Signed)
[facility] Clinic Note - Initial Visit   Date: 06/04/2022   Stanley Lee MRN: [id number] DOB: 28-Oct-1960   Dear Dr. Quillian Quince:  Thank you for your kind referral of Stanley Lee for consultation of dizziness. Although his history is well known to you, please allow [place] to reiterate it for the purpose of our medical record. The patient was accompanied to the clinic by wife who also provides collateral information.     Stanley Lee is a 62 y.o. right-handed male with hypertension, PVCs, s/p lumbar surgery with residual left foot drop presenting for evaluation of dizziness.   IMPRESSION/PLAN: Possible cervicogenic dizziness - Recommended neck PT, however patient would like to proceed with imaging of the cervical spine first.  Patient informed that imaging may be declined by insurance without trying conservative therapy  - He has prior MRIs done at PCP and Dr. Adline Mango office.  We will request these records.  History of lumbar surgery x 2 with residual left foot drop and paresthesias  Further recommendations pending results.   ------------------------------------------------------------- History of present illness: For the past 6 years, he has spells of lightheadedness which occurs 2-3 times per month and lasts a few minutes to a few weeks.  Symptoms are triggered by neck stiffness.  He had not had imaging of the cervical spine. When severe, he has associated numbness in the arms and weakness in the legs.  He checks his HR and BP which is normal during these spells.  He saw cardiology who did not find primary cardiac etiology for symptoms and referred here.  He has seen Dr. Arnoldo Morale at [facility] in the past.  He had MRI performed, but does not recall what part of the neuroaxis.  He also had MRI ordered by PCP, possibly brain.   He has residual left leg foot drop from lumbar surgery in 2011.  He wears a AFO.  No falls.    Past Medical  History:  Diagnosis Date   Heart palpitations    Hypertension    Peroneal nerve injury    PVC's (premature ventricular contractions)     Past Surgical History:  Procedure Laterality Date   BACK SURGERY     CHOLECYSTECTOMY     COLONOSCOPY W/ POLYPECTOMY     KNEE ARTHROCENTESIS     right shoulder surgery       Medications:  Outpatient Encounter Medications as of 06/04/2022  Medication Sig Note   aspirin EC 81 MG tablet Take 1 tablet (81 mg total) by mouth daily. Swallow whole.    Coenzyme Q10 (COQ-10) 200 MG CAPS Take 200 mg by mouth. Takes 2-3 x per week    losartan (COZAAR) 100 MG tablet Take 1 tablet (100 mg total) by mouth daily.    metoprolol succinate (TOPROL-XL) 25 MG 24 hr tablet Take 0.5 tablets by mouth daily. Does take 0.5 twice a day occasionally. 04/24/2022: Takes as needed    Omega-3 Fatty Acids (FISH OIL) 1000 MG CAPS Take 2,000 mg by mouth. Takes 2-3 x per week    UNABLE TO FIND Med Name: CPAP    No facility-administered encounter medications on file as of 06/04/2022.    Allergies:  Allergies  Allergen Reactions   Accupril [Quinapril Hcl] Other (See Comments)    syncope    Family History: Family History  Problem Relation Age of Onset   AAA (abdominal aortic aneurysm) Mother    Emphysema Father    Hypertension Sister    Heart disease Sister  pacemaker   Stroke Sister    Suicidality Sister    Alcohol abuse Sister    Valvular heart disease Maternal Grandfather     Social History: Social History   Tobacco Use   Smoking status: Every Day    Packs/day: 1.00    Years: 24.00    Total pack years: 24.00    Types: Cigarettes   Smokeless tobacco: Never  Substance Use Topics   Alcohol use: No   Drug use: Never   Social History   Social History Narrative   Right Handed    Lives in a one story home. Lives with wife, 2 dogs, and 10 chickens (they live outside)    Vital Signs:  BP 127/76   Pulse 76   Ht 6' 4"$  (1.93 m)   Wt 225 lb (102.1 kg)    SpO2 100%   BMI 27.39 kg/m    Neurological Exam: MENTAL STATUS including orientation to time, place, person, recent and remote memory, attention span and concentration, language, and fund of knowledge is normal.  Speech is not dysarthric.  CRANIAL NERVES: II:  No visual field defects.     III-IV-VI: Pupils equal round and reactive to light.  Normal conjugate, extra-ocular eye movements in all directions of gaze.  No nystagmus.  No ptosis V:  Normal facial sensation.    VII:  Normal facial symmetry and movements.   VIII:  Normal hearing and vestibular function.   IX-X:  Normal palatal movement.   XI:  Normal shoulder shrug and head rotation.   XII:  Normal tongue strength and range of motion, no deviation or fasciculation.  MOTOR:  No atrophy, fasciculations or abnormal movements.  No pronator drift.   Upper Extremity:  Right  Left  Deltoid  5/5   5/5   Biceps  5/5   5/5   Triceps  5/5   5/5   Wrist extensors  5/5   5/5   Wrist flexors  5/5   5/5   Finger extensors  5/5   5/5   Finger flexors  5/5   5/5   Dorsal interossei  5/5   5/5   Abductor pollicis  5/5   5/5   Tone (Ashworth scale)  0  0   Lower Extremity:  Right  Left  Hip flexors  5/5   5-/5   Knee flexors  5/5   4/5   Knee extensors  5/5   4/5   Dorsiflexors  5/5   0/5   Plantarflexors  5/5   2/5   Toe extensors  5/5   0/5   Toe flexors  5/5   0/5   Tone (Ashworth scale)  0  0   MSRs:                                           Right        Left brachioradialis 2+  2+  biceps 2+  2+  triceps 2+  2+  patellar 2+  2+  ankle jerk 2+  0  Hoffman no  no  plantar response down  down   SENSORY:  Reduced sensation over the left lower leg and foot to vibration, pin prick, and temperature.  Sensation intact in the hands and right leg.  COORDINATION/GAIT: Normal finger-to- nose-finger.  High steppage gait on the left.  Stable.    Thank you  for allowing me to participate in patient's care.  If I can answer any  additional questions, I would be pleased to do so.    Sincerely,    Oniyah Rohe K. Posey Pronto, DO

## 2022-06-04 ENCOUNTER — Ambulatory Visit (INDEPENDENT_AMBULATORY_CARE_PROVIDER_SITE_OTHER): Payer: 59 | Admitting: Neurology

## 2022-06-04 ENCOUNTER — Encounter: Payer: Self-pay | Admitting: Neurology

## 2022-06-04 VITALS — BP 127/76 | HR 76 | Ht 76.0 in | Wt 225.0 lb

## 2022-06-04 DIAGNOSIS — M542 Cervicalgia: Secondary | ICD-10-CM | POA: Diagnosis not present

## 2022-06-04 DIAGNOSIS — R42 Dizziness and giddiness: Secondary | ICD-10-CM | POA: Diagnosis not present

## 2022-06-04 NOTE — Patient Instructions (Signed)
We will request MRI from Dr. Wynonia Sours and Dr. Arcola Jansky office.  MRI cervical spine will be ordered.  If your insurance does not approve it, you may need to do physical therapy first.

## 2022-06-14 ENCOUNTER — Telehealth: Payer: Self-pay

## 2022-06-14 NOTE — Telephone Encounter (Signed)
Called patient and informed him that he may call [facility] to get his MRI scheduled. Patient stated that he will call them tomorrow to get his Mri scheduled.

## 2022-06-19 NOTE — Progress Notes (Signed)
Addendum  MRI brain wo contrast 06/09/2021 [facility]: Mild nonspecific white matter disease.  No acute infarct, hemorrhage, edema, or mass. Bilateral anterior ethmoid sinus disease. Trace right mastoid effusion.  MRI cervical spine 07/10/2017 [place]: At C5-6 there is disc height loss and a broad-based disc bulbge. Bilateral uncovertebral degenerative changes and moderate bilateral foraminal narrowing, right worse than left.

## 2022-06-25 DIAGNOSIS — G5601 Carpal tunnel syndrome, right upper limb: Secondary | ICD-10-CM | POA: Diagnosis not present

## 2022-06-27 DIAGNOSIS — M542 Cervicalgia: Secondary | ICD-10-CM | POA: Diagnosis not present

## 2022-06-27 DIAGNOSIS — R42 Dizziness and giddiness: Secondary | ICD-10-CM | POA: Diagnosis not present

## 2022-07-02 ENCOUNTER — Telehealth: Payer: Self-pay | Admitting: Neurology

## 2022-07-02 NOTE — Telephone Encounter (Signed)
Called patient and left a message for a call back.  

## 2022-07-02 NOTE — Telephone Encounter (Signed)
Please let pt know MRI cervical spine shows stenosis at C5-6 which can cause neck pain. I recommend that he continue to do neck PT.  If he continues to have neck pain or discomfort after doing PT, please have him schedule a follow-up visit.  Thanks.    MRI cervical spine performed at [facility] 06/27/2022: Moderate central canal stenosis at C5-6.  Relatively severe bilateral foraminal stenosis. No significant spinal or foraminal stenosis at other levels

## 2022-07-03 NOTE — Telephone Encounter (Signed)
Called and spoke to patient and informed him of results and recommendations per Dr. Posey Pronto. Patient stated that he will need [place] to send a referral for PT. Patient will find a location in his area and contact us with the information so we may send a referral. Patient had no further questions or concerns.

## 2022-08-09 ENCOUNTER — Other Ambulatory Visit (HOSPITAL_COMMUNITY): Payer: Self-pay

## 2022-10-29 ENCOUNTER — Encounter: Payer: Self-pay | Admitting: Cardiology

## 2022-10-29 ENCOUNTER — Ambulatory Visit: Payer: 59 | Attending: Cardiology | Admitting: Cardiology

## 2022-10-29 ENCOUNTER — Encounter: Payer: Self-pay | Admitting: *Deleted

## 2022-10-29 VITALS — BP 126/84 | HR 55 | Ht 76.0 in | Wt 223.4 lb

## 2022-10-29 DIAGNOSIS — I2510 Atherosclerotic heart disease of native coronary artery without angina pectoris: Secondary | ICD-10-CM | POA: Diagnosis not present

## 2022-10-29 DIAGNOSIS — I493 Ventricular premature depolarization: Secondary | ICD-10-CM | POA: Diagnosis not present

## 2022-10-29 DIAGNOSIS — R002 Palpitations: Secondary | ICD-10-CM | POA: Diagnosis not present

## 2022-10-29 NOTE — Progress Notes (Signed)
Clinical Summary Stanley Lee is a 62 y.o.male seen today for follow up of the following medical problems.    1. Fatigue/Palpitations - Long history of palpitations and PVCs.  - previous workup in [place], about 8 years ago. Had echocardiogram, chemical stress test, heart monitor x 7 days. He reports negative workup other than being told he had some PVCs   - significant skin irritation from monitor    - 09/2021 ER visit with bradycardia, fatigue - EKG sinus brady 55. From ER notes with ambulation HR up to 73 - he is on toprol       03/2018 30 day monitor 30 day event monitor. Data is available from only 56% of planned monitored time Min HR 44, Max HR 115, Avg HR 67. Min HR in early AM hours presumably while sleeping No symptoms reported Available tracings show sinus rhythm. No significant arrhythmias     - no recent palpitations.  - compliant with meds           2. HTN - compliant with meds    3. Atherosclerosis - aortic atherosclerosis, 3 vessel CAD.  - no chest pains, no SOB/DOE - hauls very heavy wagon during yard work, tolerates well - labs followed by pcp - ongoing smoker  - no exertional symptoms - he is on ASA 81 mg.  - not interested in statins 2022 TC 177 TG 123 HDL 31 LDL 124 ASCVD risk 16.1%      4.Dizziness - followed by neuro, from there note possible cervicogenic dizzienss    Past Medical History:  Diagnosis Date   Heart palpitations    Hypertension    Peroneal nerve injury    PVC's (premature ventricular contractions)      Allergies  Allergen Reactions   Accupril [Quinapril Hcl] Other (See Comments)    syncope     Current Outpatient Medications  Medication Sig Dispense Refill   aspirin EC 81 MG tablet Take 1 tablet (81 mg total) by mouth daily. Swallow whole.     Coenzyme Q10 (COQ-10) 200 MG CAPS Take 200 mg by mouth. Takes 2-3 x per week     losartan (COZAAR) 100 MG tablet Take 1 tablet (100 mg total) by mouth  daily. 90 tablet 3   metoprolol succinate (TOPROL-XL) 25 MG 24 hr tablet Take 0.5 tablets by mouth daily. Does take 0.5 twice a day occasionally.  0   Omega-3 Fatty Acids (FISH OIL) 1000 MG CAPS Take 2,000 mg by mouth. Takes 2-3 x per week     UNABLE TO FIND Med Name: CPAP     No current facility-administered medications for this visit.     Past Surgical History:  Procedure Laterality Date   BACK SURGERY     CHOLECYSTECTOMY     COLONOSCOPY W/ POLYPECTOMY     KNEE ARTHROCENTESIS     right shoulder surgery       Allergies  Allergen Reactions   Accupril [Quinapril Hcl] Other (See Comments)    syncope      Family History  Problem Relation Age of Onset   AAA (abdominal aortic aneurysm) Mother    Emphysema Father    Hypertension Sister    Heart disease Sister        pacemaker   Stroke Sister    Suicidality Sister    Alcohol abuse Sister    Valvular heart disease Maternal Grandfather      Social History Mr. Rickel reports that he has been  smoking cigarettes. He has a 24 pack-year smoking history. He has never used smokeless tobacco. Mr. Russomanno reports no history of alcohol use.   Review of Systems CONSTITUTIONAL: No weight loss, fever, chills, weakness or fatigue.  HEENT: Eyes: No visual loss, blurred vision, double vision or yellow sclerae.No hearing loss, sneezing, congestion, runny nose or sore throat.  SKIN: No rash or itching.  CARDIOVASCULAR: no chest pain, no palpitations.  RESPIRATORY: No shortness of breath, cough or sputum.  GASTROINTESTINAL: No anorexia, nausea, vomiting or diarrhea. No abdominal pain or blood.  GENITOURINARY: No burning on urination, no polyuria NEUROLOGICAL: No headache, dizziness, syncope, paralysis, ataxia, numbness or tingling in the extremities. No change in bowel or bladder control.  MUSCULOSKELETAL: No muscle, back pain, joint pain or stiffness.  LYMPHATICS: No enlarged nodes. No history of splenectomy.  PSYCHIATRIC: No history of  depression or anxiety.  ENDOCRINOLOGIC: No reports of sweating, cold or heat intolerance. No polyuria or polydipsia.  Marland Kitchen   Physical Examination Today's Vitals   10/29/22 0812  BP: 126/84  Pulse: (!) 55  SpO2: 99%  Weight: 223 lb 6.4 oz (101.3 kg)  Height: 6\' 4"  (1.93 m)   Body mass index is 27.19 kg/m.  Gen: resting comfortably, no acute distress HEENT: no scleral icterus, pupils equal round and reactive, no palptable cervical adenopathy,  CV: RRR, no mrg, no jvd Resp: Clear to auscultation bilaterally GI: abdomen is soft, non-tender, non-distended, normal bowel sounds, no hepatosplenomegaly MSK: extremities are warm, no edema.  Skin: warm, no rash Neuro:  no focal deficits Psych: appropriate affect   Diagnostic Studies 03/2018 30 day monitor 30 day event monitor. Data is available from only 56% of planned monitored time Min HR 44, Max HR 115, Avg HR 67. Min HR in early AM hours presumably while sleeping No symptoms reported Available tracings show sinus rhythm. No significant arrhythmias    Assessment and Plan   1.PVCs/palpitations  - no recent symptoms, continue toprol  2. Coronary atherosclerosis - noted on prior CT scan - 10 year ASCVD risk 18.4%, we discussed benefits of starting statin.At this time he is not interested in starting statin therapy, continue ASA 81mg   3. HTN - at goal, continue current meds        Antoine Poche, M.D.

## 2022-10-29 NOTE — Patient Instructions (Signed)
Medication Instructions:  Continue all current medications.   Labwork: none  Testing/Procedures: none  Follow-Up: 6 months   Any Other Special Instructions Will Be Listed Below (If Applicable).   If you need a refill on your cardiac medications before your next appointment, please call your pharmacy.  

## 2022-11-06 ENCOUNTER — Other Ambulatory Visit: Payer: Self-pay

## 2023-01-13 ENCOUNTER — Emergency Department (HOSPITAL_COMMUNITY): Payer: 59

## 2023-01-13 ENCOUNTER — Emergency Department (HOSPITAL_COMMUNITY)
Admission: EM | Admit: 2023-01-13 | Discharge: 2023-01-13 | Disposition: A | Discharge status: Home / Self Care | Payer: 59 | Attending: Emergency Medicine | Admitting: Emergency Medicine

## 2023-01-13 ENCOUNTER — Other Ambulatory Visit: Payer: Self-pay

## 2023-01-13 ENCOUNTER — Encounter (HOSPITAL_COMMUNITY): Payer: Self-pay | Admitting: Emergency Medicine

## 2023-01-13 DIAGNOSIS — R079 Chest pain, unspecified: Secondary | ICD-10-CM | POA: Diagnosis not present

## 2023-01-13 DIAGNOSIS — Z7982 Long term (current) use of aspirin: Secondary | ICD-10-CM | POA: Insufficient documentation

## 2023-01-13 DIAGNOSIS — R0789 Other chest pain: Secondary | ICD-10-CM | POA: Diagnosis not present

## 2023-01-13 DIAGNOSIS — I10 Essential (primary) hypertension: Secondary | ICD-10-CM | POA: Diagnosis not present

## 2023-01-13 DIAGNOSIS — R1013 Epigastric pain: Secondary | ICD-10-CM | POA: Diagnosis not present

## 2023-01-13 DIAGNOSIS — Z79899 Other long term (current) drug therapy: Secondary | ICD-10-CM | POA: Insufficient documentation

## 2023-01-13 LAB — BASIC METABOLIC PANEL
Anion gap: 9 (ref 5–15)
BUN: 14 mg/dL (ref 6–20)
CO2: 27 mmol/L (ref 22–32)
Calcium: 9 mg/dL (ref 8.9–10.3)
Chloride: 102 mmol/L (ref 98–111)
Creatinine, Ser: 0.79 mg/dL (ref 0.61–1.24)
GFR, Estimated: >60 mL/min (ref >60)
Glucose, Bld: 89 mg/dL (ref 70–99)
Potassium: 4 mmol/L (ref 3.5–5.1)
Sodium: 138 mmol/L (ref 135–145)

## 2023-01-13 LAB — HEPATIC FUNCTION PANEL
ALT: 17 U/L (ref 0–44)
AST: 21 U/L (ref 15–41)
Albumin: 4 g/dL (ref 3.5–5.0)
Alkaline Phosphatase: 68 U/L (ref 38–126)
Bilirubin, Direct: 0.1 mg/dL (ref 0.0–0.2)
Indirect Bilirubin: 0.8 mg/dL (ref 0.3–0.9)
Total Bilirubin: 0.9 mg/dL (ref 0.3–1.2)
Total Protein: 7.3 g/dL (ref 6.5–8.1)

## 2023-01-13 LAB — TROPONIN I (HIGH SENSITIVITY)
Troponin I (High Sensitivity): 3 ng/L (ref <18)
Troponin I (High Sensitivity): 4 ng/L (ref <18)

## 2023-01-13 LAB — CBC
HCT: 43.2 % (ref 39.0–52.0)
Hemoglobin: 14.9 g/dL (ref 13.0–17.0)
MCH: 30.9 pg (ref 26.0–34.0)
MCHC: 34.5 g/dL (ref 30.0–36.0)
MCV: 89.6 fL (ref 80.0–100.0)
Platelets: 178 10*3/uL (ref 150–400)
RBC: 4.82 MIL/uL (ref 4.22–5.81)
RDW: 12.5 % (ref 11.5–15.5)
WBC: 7 10*3/uL (ref 4.0–10.5)
nRBC: 0 % (ref 0.0–0.2)

## 2023-01-13 NOTE — ED Triage Notes (Signed)
Pt c/o pain to upper right quadrant/epigastric area into back. Denies pain with deep breathing. Had jaw pain 2 days ago. Denies shob/n/v/d.  Wife gave pt a ntg at 530pm and increase in bp.  Pt a/o. Color wnl. Non diaphoretic.

## 2023-01-13 NOTE — ED Provider Triage Note (Signed)
Emergency Medicine Provider Triage Evaluation Note  Stanley Lee , a 62 y.o. male  was evaluated in triage.  Pt complains of right side chest pain since 2pm today.    Review of Systems  Positive: Right sided pain Negative: Fever   Physical Exam  BP (!) 159/93 (BP Location: Right Arm)   Pulse 70   Temp 97.8 F (36.6 C) (Oral)   Resp 20   SpO2 100%  Gen:   Awake, no distress   Resp:  Normal effort  MSK:   Moves extremities without difficulty  Other:    Medical Decision Making  Medically screening exam initiated at 9:11 PM.  Appropriate orders placed.  Stanley Lee was informed that the remainder of the evaluation will be completed by another provider, this initial triage assessment does not replace that evaluation, and the importance of remaining in the ED until their evaluation   Stanley Lee, Cordelia Poche 01/13/23 2145

## 2023-01-13 NOTE — Discharge Instructions (Signed)
Workup for the chest pain without any acute findings.  Cardiac enzymes were normal.  Chest x-ray normal.  Would keep in mind in March for possible development of a rash because this does seem to be somewhat dermatomal pain.  Could be the onset of shingles.  Return for any new or worse symptoms.  Make cardiology aware of the chest pain.

## 2023-01-13 NOTE — ED Provider Notes (Signed)
[place] EMERGENCY DEPARTMENT AT [facility] Provider Note   CSN: [id number] Arrival date & time: 01/13/23  1853     History  Chief Complaint  Patient presents with   Abdominal Pain   Chest Pain    Stanley Lee is a 62 y.o. male.  Patient with acute onset of chest pain at 1400 today.  Predominantly right anterior and lateral part of the chest area.  Also seems to be radiating around from the tip of the scapula.  Not associated with any abdominal pain nausea vomiting or diarrhea.  Patient has had his gallbladder removed.  Also not associated with any shortness of breath.  Patient has had several kind of pain problems in his Dr. Claris Gladden trying to figure it out.  He is also seen cardiology in the past.  And they felt none of it was cardiac related.  Patient is an everyday smoker.  Patient is had his gallbladder removed.  Patient has a history of premature ventricular contractions history of heart palpitations and hypertension.       Home Medications Prior to Admission medications   Medication Sig Start Date End Date Taking? Authorizing Provider  aspirin EC 81 MG tablet Take 1 tablet (81 mg total) by mouth daily. Swallow whole. 04/24/22   Antoine Poche, MD  Coenzyme Q10 (COQ-10) 200 MG CAPS Take 200 mg by mouth. Takes 2-3 x per week    [provider]  losartan (COZAAR) 100 MG tablet Take 1 tablet (100 mg total) by mouth daily. 01/19/22   Antoine Poche, MD  metoprolol succinate (TOPROL-XL) 25 MG 24 hr tablet Take 0.5 tablets by mouth daily. Does take 0.5 twice a day occasionally. 03/15/18   [provider]  Omega-3 Fatty Acids (FISH OIL) 1000 MG CAPS Take 2,000 mg by mouth. Takes 2-3 x per week    [provider]  UNABLE TO FIND Med Name: CPAP    [provider]      Allergies    Accupril [quinapril hcl]    Review of Systems   Review of Systems  Constitutional:  Negative for chills and fever.  HENT:  Negative for ear pain  and sore throat.   Eyes:  Negative for pain and visual disturbance.  Respiratory:  Negative for cough and shortness of breath.   Cardiovascular:  Positive for chest pain. Negative for palpitations.  Gastrointestinal:  Negative for abdominal pain and vomiting.  Genitourinary:  Negative for dysuria and hematuria.  Musculoskeletal:  Negative for arthralgias and back pain.  Skin:  Negative for color change and rash.  Neurological:  Negative for seizures and syncope.  All other systems reviewed and are negative.   Physical Exam Updated Vital Signs BP (!) 141/76   Pulse 66   Temp 97.8 F (36.6 C) (Oral)   Resp 19   SpO2 97%  Physical Exam Vitals and nursing note reviewed.  Constitutional:      General: He is not in acute distress.    Appearance: Normal appearance. He is well-developed.  HENT:     Head: Normocephalic and atraumatic.  Eyes:     Conjunctiva/sclera: Conjunctivae normal.  Cardiovascular:     Rate and Rhythm: Normal rate and regular rhythm.     Heart sounds: No murmur heard. Pulmonary:     Effort: Pulmonary effort is normal. No respiratory distress.     Breath sounds: Normal breath sounds.  Chest:     Chest wall: No tenderness.  Abdominal:  General: There is no distension.     Palpations: Abdomen is soft.     Tenderness: There is no abdominal tenderness. There is no guarding.  Musculoskeletal:        General: No swelling.     Cervical back: Normal range of motion and neck supple.     Right lower leg: No edema.     Left lower leg: No edema.  Skin:    General: Skin is warm and dry.     Capillary Refill: Capillary refill takes less than 2 seconds.  Neurological:     General: No focal deficit present.     Mental Status: He is alert and oriented to person, place, and time.  Psychiatric:        Mood and Affect: Mood normal.     ED Results / Procedures / Treatments   Labs (all labs ordered are listed, but only abnormal results are displayed) Labs Reviewed   BASIC METABOLIC PANEL  CBC  HEPATIC FUNCTION PANEL  TROPONIN I (HIGH SENSITIVITY)  TROPONIN I (HIGH SENSITIVITY)    EKG EKG Interpretation Date/Time:  Sunday January 13 2023 19:00:01 EDT Ventricular Rate:  65 PR Interval:  150 QRS Duration:  104 QT Interval:  386 QTC Calculation: 401 R Axis:   55  Text Interpretation: Normal sinus rhythm Normal ECG When compared with ECG of 29-Oct-2022 08:17, No significant change was found Confirmed by Vanetta Mulders [phone]) on 01/13/2023 9:20:09 PM  Radiology DG Chest 2 View  Result Date: 01/13/2023 CLINICAL DATA:  Epigastric and chest pain extending to the back. EXAM: CHEST - 2 VIEW COMPARISON:  09/20/2021 FINDINGS: Heart size is normal. No aortic atherosclerotic calcification or tortuosity is seen. The pulmonary vascularity is normal. The lungs are clear. There is mild scoliosis and degenerative change of the spine. IMPRESSION: No active cardiopulmonary disease. Mild scoliosis and degenerative change of the spine. Electronically Signed   By: Paulina Fusi M.D.   On: 01/13/2023 19:30    Procedures Procedures    Medications Ordered in ED Medications - No data to display  ED Course/ Medical Decision Making/ A&P                                 Medical Decision Making Amount and/or Complexity of Data Reviewed Labs: ordered. Radiology: ordered.   Workup here very reassuring troponins x 2 were very normal first 1 was 3-second 1 was 4.  BMP is normal hepatic function panel is normal.  CBC no leukocytosis platelets 178 hemoglobin 14.9.  Chest x-ray negative.  Patient without any hypoxia no leg swelling.  Not concerned about pulmonary embolus.  It is possible that this could be secondary to developing shingles.  Patient given precautions.  Patient can follow-up with his doctor as well as cardiology.  If he develops a rash she will return. Final Clinical Impression(s) / ED Diagnoses Final diagnoses:  Atypical chest pain    Rx / DC  Orders ED Discharge Orders     None         Vanetta Mulders, MD 01/13/23 2310

## 2023-01-28 ENCOUNTER — Other Ambulatory Visit (HOSPITAL_COMMUNITY): Payer: Self-pay

## 2023-01-28 ENCOUNTER — Other Ambulatory Visit: Payer: Self-pay

## 2023-01-28 MED ORDER — LOSARTAN POTASSIUM 100 MG PO TABS
100.0000 mg | ORAL_TABLET | Freq: Every day | ORAL | 1 refills | Status: DC
  Filled 2023-01-28: qty 90, 90d supply, fill #0
  Filled 2023-05-17: qty 90, 90d supply, fill #1

## 2023-04-19 ENCOUNTER — Ambulatory Visit: Payer: 59 | Attending: Cardiology | Admitting: Cardiology

## 2023-04-19 VITALS — BP 124/76 | HR 67 | Ht 76.0 in | Wt 231.0 lb

## 2023-04-19 DIAGNOSIS — I493 Ventricular premature depolarization: Secondary | ICD-10-CM | POA: Diagnosis not present

## 2023-04-19 DIAGNOSIS — I2510 Atherosclerotic heart disease of native coronary artery without angina pectoris: Secondary | ICD-10-CM

## 2023-04-19 DIAGNOSIS — R002 Palpitations: Secondary | ICD-10-CM | POA: Diagnosis not present

## 2023-04-19 DIAGNOSIS — I10 Essential (primary) hypertension: Secondary | ICD-10-CM | POA: Diagnosis not present

## 2023-04-19 NOTE — Progress Notes (Signed)
 Clinical Summary Stanley Lee is a 63 y.o.male seen today for follow up of the following medical problems.     1. Fatigue/Palpitations - Long history of palpitations and PVCs.  - previous workup in [place], about 8 years ago. Had echocardiogram, chemical stress test, heart monitor x 7 days. He reports negative workup other than being told he had some PVCs   - significant skin irritation from monitor    - 09/2021 ER visit with bradycardia, fatigue - EKG sinus brady 55. From ER notes with ambulation HR up to 73 - he is on toprol      03/2018 30 day monitor 30 day event monitor. Data is available from only 56% of planned monitored time Min HR 44, Max HR 115, Avg HR 67. Min HR in early AM hours presumably while sleeping No symptoms reported Available tracings show sinus rhythm. No significant arrhythmias      - no recent palpitations - has toprol , takes just prn.          2. HTN -he is compliant with meds     3. Atherosclerosis - aortic atherosclerosis, 3 vessel CAD.  - no chest pains, no SOB/DOE - hauls very heavy wagon during yard work, tolerates well - labs followed by pcp - ongoing smoker   - no exertional symptoms - he is on ASA 81 mg.  - not interested in statins 2022 TC 177 TG 123 HDL 31 LDL 124 ASCVD risk 81.5%    - upcoming labs with pcp - remains not interested in statin    4.Dizziness - followed by neuro, from there note possible cervicogenic dizzienss   Past Medical History:  Diagnosis Date   Heart palpitations    Hypertension    Peroneal nerve injury    PVC's (premature ventricular contractions)      Allergies  Allergen Reactions   Accupril [Quinapril Hcl] Other (See Comments)    syncope     Current Outpatient Medications  Medication Sig Dispense Refill   aspirin  EC 81 MG tablet Take 1 tablet (81 mg total) by mouth daily. Swallow whole.     Coenzyme Q10 (COQ-10) 200 MG CAPS Take 200 mg by mouth. Takes 2-3 x per week      losartan  (COZAAR ) 100 MG tablet Take 1 tablet (100 mg total) by mouth daily. 90 tablet 1   metoprolol  succinate (TOPROL -XL) 25 MG 24 hr tablet Take 0.5 tablets by mouth daily. Does take 0.5 twice a day occasionally.  0   Omega-3 Fatty Acids (FISH OIL) 1000 MG CAPS Take 2,000 mg by mouth. Takes 2-3 x per week     UNABLE TO FIND Med Name: CPAP     No current facility-administered medications for this visit.     Past Surgical History:  Procedure Laterality Date   BACK SURGERY     CHOLECYSTECTOMY     COLONOSCOPY W/ POLYPECTOMY     KNEE ARTHROCENTESIS     right shoulder surgery       Allergies  Allergen Reactions   Accupril [Quinapril Hcl] Other (See Comments)    syncope      Family History  Problem Relation Age of Onset   AAA (abdominal aortic aneurysm) Mother    Emphysema Father    Hypertension Sister    Heart disease Sister        pacemaker   Stroke Sister    Suicidality Sister    Alcohol abuse Sister    Valvular heart disease Maternal Grandfather  Social History Mr. Duggar reports that he has been smoking cigarettes. He has a 24 pack-year smoking history. He has never used smokeless tobacco. Mr. Bollard reports no history of alcohol use.   Review of Systems CONSTITUTIONAL: No weight loss, fever, chills, weakness or fatigue.  HEENT: Eyes: No visual loss, blurred vision, double vision or yellow sclerae.No hearing loss, sneezing, congestion, runny nose or sore throat.  SKIN: No rash or itching.  CARDIOVASCULAR: per hpi RESPIRATORY: No shortness of breath, cough or sputum.  GASTROINTESTINAL: No anorexia, nausea, vomiting or diarrhea. No abdominal pain or blood.  GENITOURINARY: No burning on urination, no polyuria NEUROLOGICAL: No headache, dizziness, syncope, paralysis, ataxia, numbness or tingling in the extremities. No change in bowel or bladder control.  MUSCULOSKELETAL: No muscle, back pain, joint pain or stiffness.  LYMPHATICS: No enlarged nodes. No history  of splenectomy.  PSYCHIATRIC: No history of depression or anxiety.  ENDOCRINOLOGIC: No reports of sweating, cold or heat intolerance. No polyuria or polydipsia.  SABRA   Physical Examination Today's Vitals   04/19/23 0813  BP: 124/76  Pulse: 67  SpO2: 98%  Weight: 231 lb (104.8 kg)  Height: 6' 4 (1.93 m)   Body mass index is 28.12 kg/m.  Gen: resting comfortably, no acute distress HEENT: no scleral icterus, pupils equal round and reactive, no palptable cervical adenopathy,  CV: RRR, no mrg, no jvd Resp: Clear to auscultation bilaterally GI: abdomen is soft, non-tender, non-distended, normal bowel sounds, no hepatosplenomegaly MSK: extremities are warm, no edema.  Skin: warm, no rash Neuro:  no focal deficits Psych: appropriate affect   Diagnostic Studies  03/2018 30 day monitor 30 day event monitor. Data is available from only 56% of planned monitored time Min HR 44, Max HR 115, Avg HR 67. Min HR in early AM hours presumably while sleeping No symptoms reported Available tracings show sinus rhythm. No significant arrhythmias   Assessment and Plan  1.PVCs/palpitations  - no regular symptoms, tend to be sporadic and last a few days. During those times will take his toprol , otherwise does not need on a regular basis.    2. Coronary atherosclerosis - noted on prior CT scan - 10 year ASCVD risk 18.4%, we discussed benefits of starting statin.At this time he is not interested in starting statin therapy, continue ASA 81mg  - remains not interested in statin   3. HTN - he is at goal, continue current meds  F/u 1 year      Dorn PHEBE Ross, M.D.

## 2023-04-19 NOTE — Patient Instructions (Signed)

## 2023-05-14 IMAGING — DX DG CHEST 1V PORT
2 series · 2 of 2 positions shown · non-contrast
Comparison: 11/19/2015

CLINICAL DATA: Palpitations.  Shortness of breath.  Bradycardia.

EXAM:
PORTABLE CHEST 1 VIEW

[chest ap (1 of 2)]
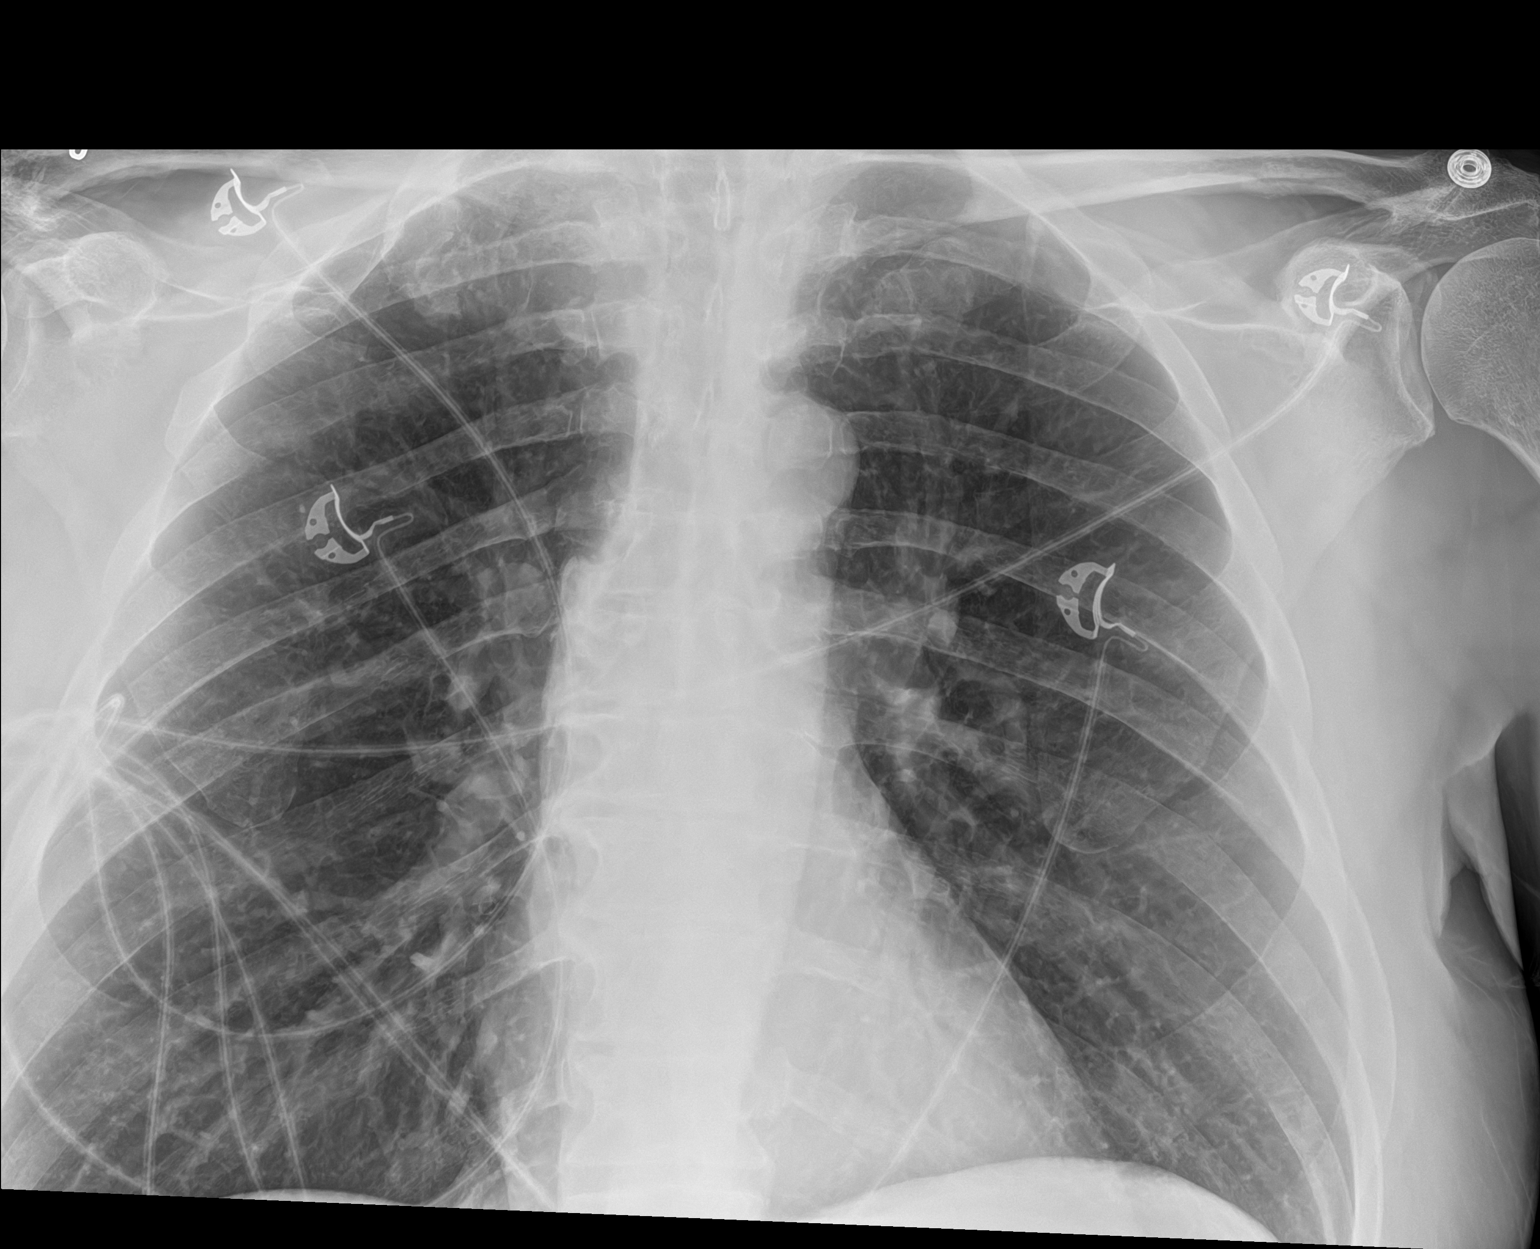

[chest ap (2 of 2)]
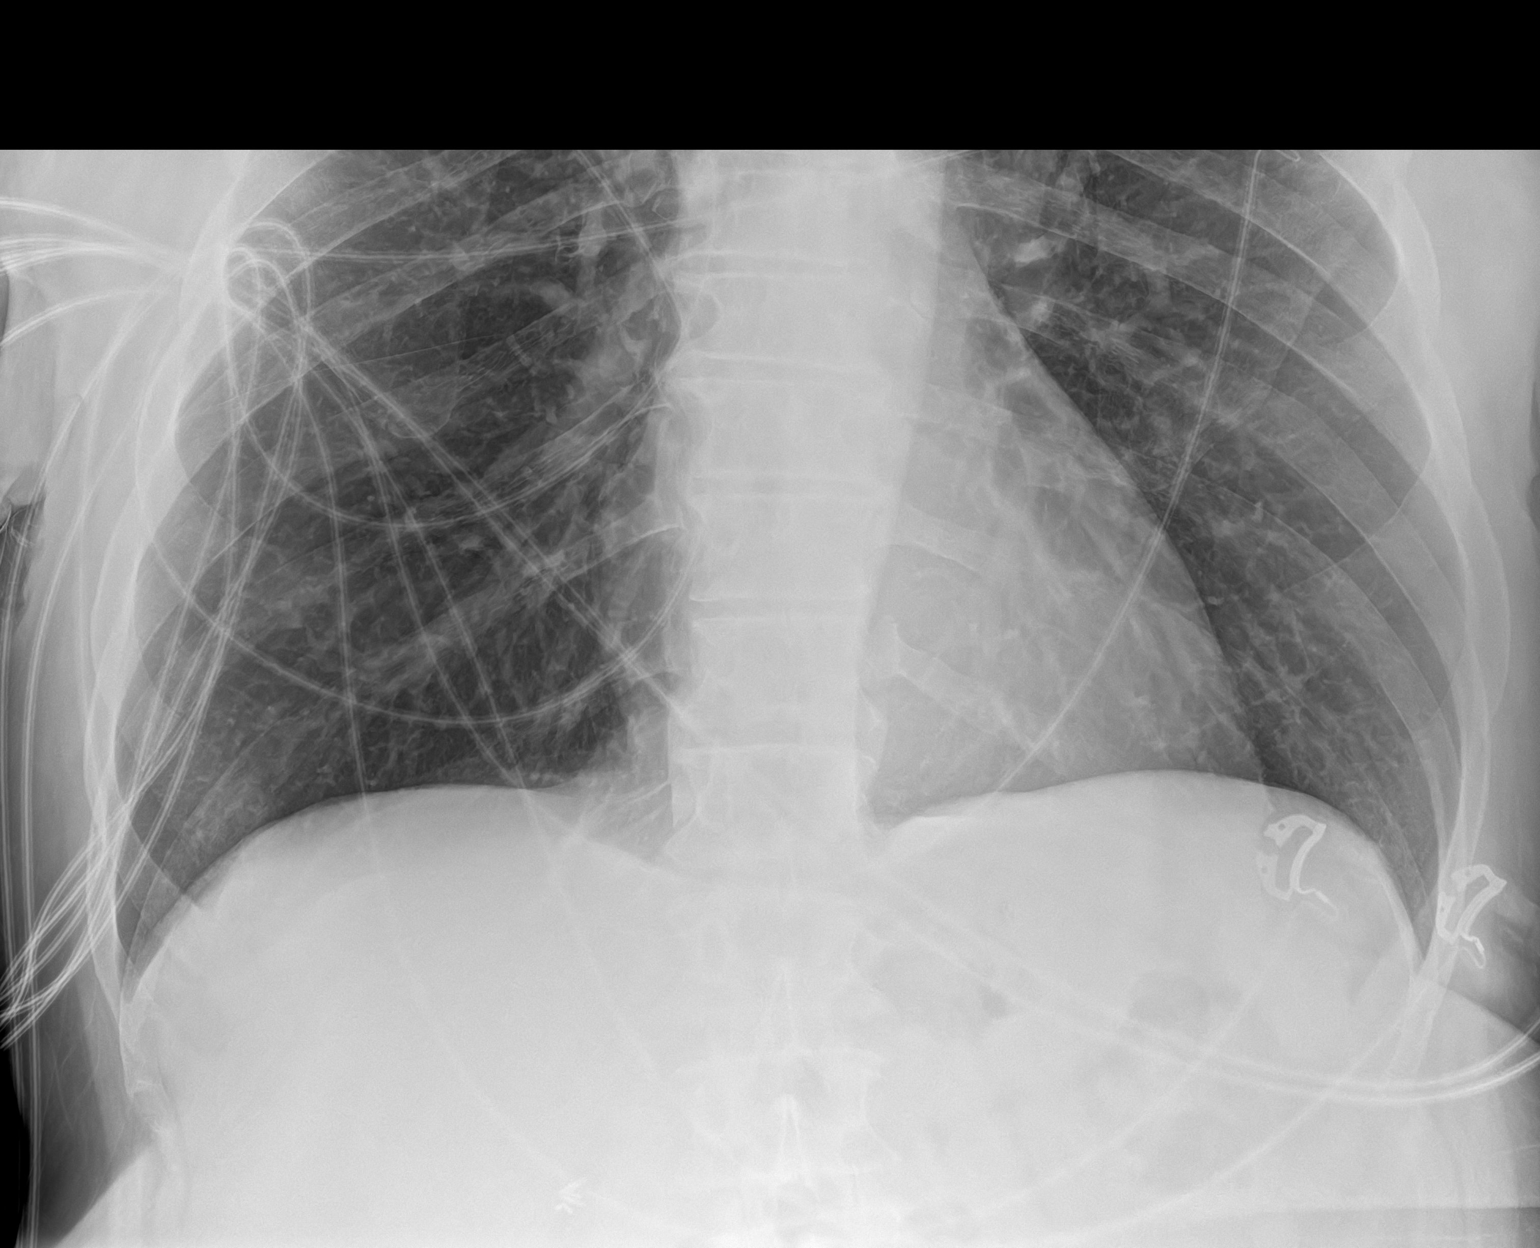

[2 of 2 positions shown; findings below may reference images not displayed]

FINDINGS: The heart size and mediastinal contours are within normal limits.
Both lungs are clear. The visualized skeletal structures are
unremarkable.
IMPRESSION: No active disease.

## 2023-05-17 ENCOUNTER — Other Ambulatory Visit (HOSPITAL_COMMUNITY): Payer: Self-pay

## 2023-07-02 DIAGNOSIS — M47812 Spondylosis without myelopathy or radiculopathy, cervical region: Secondary | ICD-10-CM | POA: Diagnosis not present

## 2023-07-02 DIAGNOSIS — Z6826 Body mass index (BMI) 26.0-26.9, adult: Secondary | ICD-10-CM | POA: Diagnosis not present

## 2023-08-26 ENCOUNTER — Other Ambulatory Visit (HOSPITAL_COMMUNITY): Payer: Self-pay

## 2023-08-26 MED ORDER — LOSARTAN POTASSIUM 100 MG PO TABS
100.0000 mg | ORAL_TABLET | Freq: Every day | ORAL | 1 refills | Status: DC
  Filled 2023-08-26: qty 90, 90d supply, fill #0
  Filled 2023-11-23: qty 90, 90d supply, fill #1

## 2023-10-08 ENCOUNTER — Other Ambulatory Visit (HOSPITAL_COMMUNITY): Payer: Self-pay

## 2023-10-08 ENCOUNTER — Other Ambulatory Visit: Payer: Self-pay

## 2023-10-08 MED ORDER — AMOXICILLIN-POT CLAVULANATE 875-125 MG PO TABS
1.0000 | ORAL_TABLET | Freq: Two times a day (BID) | ORAL | 0 refills | Status: AC
  Filled 2023-10-08: qty 14, 7d supply, fill #0

## 2023-11-23 ENCOUNTER — Encounter (HOSPITAL_COMMUNITY): Payer: Self-pay

## 2023-11-23 ENCOUNTER — Other Ambulatory Visit (HOSPITAL_COMMUNITY): Payer: Self-pay

## 2023-11-25 ENCOUNTER — Other Ambulatory Visit (HOSPITAL_COMMUNITY): Payer: Self-pay

## 2024-02-19 DIAGNOSIS — Z0001 Encounter for general adult medical examination with abnormal findings: Secondary | ICD-10-CM | POA: Diagnosis not present

## 2024-02-19 DIAGNOSIS — E7849 Other hyperlipidemia: Secondary | ICD-10-CM | POA: Diagnosis not present

## 2024-02-19 DIAGNOSIS — E875 Hyperkalemia: Secondary | ICD-10-CM | POA: Diagnosis not present

## 2024-02-19 DIAGNOSIS — D559 Anemia due to enzyme disorder, unspecified: Secondary | ICD-10-CM | POA: Diagnosis not present

## 2024-02-19 DIAGNOSIS — Z125 Encounter for screening for malignant neoplasm of prostate: Secondary | ICD-10-CM | POA: Diagnosis not present

## 2024-02-19 DIAGNOSIS — I10 Essential (primary) hypertension: Secondary | ICD-10-CM | POA: Diagnosis not present

## 2024-02-19 DIAGNOSIS — Z1329 Encounter for screening for other suspected endocrine disorder: Secondary | ICD-10-CM | POA: Diagnosis not present

## 2024-02-19 DIAGNOSIS — E559 Vitamin D deficiency, unspecified: Secondary | ICD-10-CM | POA: Diagnosis not present

## 2024-02-19 DIAGNOSIS — R5383 Other fatigue: Secondary | ICD-10-CM | POA: Diagnosis not present

## 2024-02-20 ENCOUNTER — Other Ambulatory Visit (HOSPITAL_COMMUNITY): Payer: Self-pay

## 2024-02-20 ENCOUNTER — Other Ambulatory Visit: Payer: Self-pay

## 2024-02-20 MED ORDER — LOSARTAN POTASSIUM 100 MG PO TABS
100.0000 mg | ORAL_TABLET | Freq: Every day | ORAL | 1 refills | Status: AC
  Filled 2024-02-20: qty 90, 90d supply, fill #0
  Filled 2024-05-21: qty 90, 90d supply, fill #1

## 2024-05-21 ENCOUNTER — Other Ambulatory Visit: Payer: Self-pay
